# Patient Record
Sex: Female | Born: 1960
Health system: Southern US, Community
[De-identification: ages and names within clinical notes are randomized; demographics above are authoritative.]

## PROBLEM LIST (undated history)

## (undated) DIAGNOSIS — D649 Anemia, unspecified: Secondary | ICD-10-CM

## (undated) DIAGNOSIS — E785 Hyperlipidemia, unspecified: Secondary | ICD-10-CM

## (undated) HISTORY — PX: PARTIAL HYSTERECTOMY: SHX80

## (undated) HISTORY — DX: Anemia, unspecified: D64.9

## (undated) HISTORY — DX: Hyperlipidemia, unspecified: E78.5

---

## 1997-11-05 ENCOUNTER — Other Ambulatory Visit: Admission: RE | Admit: 1997-11-05 | Discharge: 1997-11-05 | Payer: Self-pay | Admitting: Obstetrics and Gynecology

## 1998-05-10 ENCOUNTER — Ambulatory Visit (HOSPITAL_COMMUNITY): Admission: RE | Admit: 1998-05-10 | Discharge: 1998-05-10 | Payer: Self-pay | Admitting: Obstetrics and Gynecology

## 1998-11-26 ENCOUNTER — Other Ambulatory Visit: Admission: RE | Admit: 1998-11-26 | Discharge: 1998-11-26 | Payer: Self-pay | Admitting: Obstetrics and Gynecology

## 1999-12-25 ENCOUNTER — Other Ambulatory Visit: Admission: RE | Admit: 1999-12-25 | Discharge: 1999-12-25 | Payer: Self-pay | Admitting: Obstetrics and Gynecology

## 2001-11-16 ENCOUNTER — Other Ambulatory Visit: Admission: RE | Admit: 2001-11-16 | Discharge: 2001-11-16 | Payer: Self-pay | Admitting: Obstetrics and Gynecology

## 2005-02-12 ENCOUNTER — Other Ambulatory Visit: Admission: RE | Admit: 2005-02-12 | Discharge: 2005-02-12 | Payer: Self-pay | Admitting: Obstetrics and Gynecology

## 2009-02-01 ENCOUNTER — Encounter (INDEPENDENT_AMBULATORY_CARE_PROVIDER_SITE_OTHER): Payer: Self-pay | Admitting: *Deleted

## 2009-02-14 ENCOUNTER — Ambulatory Visit: Payer: Self-pay | Admitting: Internal Medicine

## 2009-02-14 ENCOUNTER — Encounter: Payer: Self-pay | Admitting: Gastroenterology

## 2009-02-15 ENCOUNTER — Encounter: Payer: Self-pay | Admitting: Gastroenterology

## 2009-02-20 LAB — CONVERTED CEMR LAB
IgA: 118 mg/dL (ref 68–378)
Tissue Transglutaminase Ab, IgA: 0.2 units (ref <7)

## 2009-02-21 ENCOUNTER — Encounter: Payer: Self-pay | Admitting: Internal Medicine

## 2009-02-21 ENCOUNTER — Ambulatory Visit (HOSPITAL_COMMUNITY): Admission: RE | Admit: 2009-02-21 | Discharge: 2009-02-21 | Payer: Self-pay | Admitting: Internal Medicine

## 2009-02-21 ENCOUNTER — Ambulatory Visit: Payer: Self-pay | Admitting: Internal Medicine

## 2009-02-25 ENCOUNTER — Encounter: Payer: Self-pay | Admitting: Internal Medicine

## 2009-03-13 ENCOUNTER — Telehealth (INDEPENDENT_AMBULATORY_CARE_PROVIDER_SITE_OTHER): Payer: Self-pay

## 2009-03-15 ENCOUNTER — Emergency Department (HOSPITAL_COMMUNITY): Admission: EM | Admit: 2009-03-15 | Discharge: 2009-03-16 | Payer: Self-pay | Admitting: Emergency Medicine

## 2009-03-18 ENCOUNTER — Telehealth (INDEPENDENT_AMBULATORY_CARE_PROVIDER_SITE_OTHER): Payer: Self-pay

## 2009-03-18 ENCOUNTER — Encounter (INDEPENDENT_AMBULATORY_CARE_PROVIDER_SITE_OTHER): Payer: Self-pay | Admitting: *Deleted

## 2009-04-08 ENCOUNTER — Telehealth (INDEPENDENT_AMBULATORY_CARE_PROVIDER_SITE_OTHER): Payer: Self-pay

## 2009-04-16 ENCOUNTER — Ambulatory Visit: Payer: Self-pay | Admitting: Internal Medicine

## 2009-04-16 DIAGNOSIS — K5150 Left sided colitis without complications: Secondary | ICD-10-CM | POA: Insufficient documentation

## 2009-05-07 ENCOUNTER — Encounter: Payer: Self-pay | Admitting: Internal Medicine

## 2009-06-19 ENCOUNTER — Ambulatory Visit: Payer: Self-pay | Admitting: Internal Medicine

## 2009-07-12 ENCOUNTER — Ambulatory Visit: Payer: Self-pay | Admitting: Internal Medicine

## 2009-07-15 LAB — CONVERTED CEMR LAB
ALT: 23 units/L (ref 0–35)
AST: 17 units/L (ref 0–37)
Albumin: 3.6 g/dL (ref 3.5–5.2)
Alkaline Phosphatase: 39 units/L (ref 39–117)
Basophils Absolute: 0 10*3/uL (ref 0.0–0.1)
Basophils Relative: 0.6 % (ref 0.0–3.0)
Bilirubin, Direct: 0 mg/dL (ref 0.0–0.3)
Eosinophils Absolute: 0 10*3/uL (ref 0.0–0.7)
Eosinophils Relative: 0.3 % (ref 0.0–5.0)
HCT: 32.7 % — ABNORMAL LOW (ref 36.0–46.0)
Hemoglobin: 11 g/dL — ABNORMAL LOW (ref 12.0–15.0)
Lymphocytes Relative: 11.2 % — ABNORMAL LOW (ref 12.0–46.0)
Lymphs Abs: 0.9 10*3/uL (ref 0.7–4.0)
MCHC: 33.6 g/dL (ref 30.0–36.0)
MCV: 89.3 fL (ref 78.0–100.0)
Monocytes Absolute: 0.3 10*3/uL (ref 0.1–1.0)
Monocytes Relative: 3.4 % (ref 3.0–12.0)
Neutro Abs: 6.7 10*3/uL (ref 1.4–7.7)
Neutrophils Relative %: 84.5 % — ABNORMAL HIGH (ref 43.0–77.0)
Platelets: 477 10*3/uL — ABNORMAL HIGH (ref 150.0–400.0)
RBC: 3.66 M/uL — ABNORMAL LOW (ref 3.87–5.11)
RDW: 15.9 % — ABNORMAL HIGH (ref 11.5–14.6)
Total Bilirubin: 0.7 mg/dL (ref 0.3–1.2)
Total Protein: 7.4 g/dL (ref 6.0–8.3)
WBC: 7.9 10*3/uL (ref 4.5–10.5)

## 2009-07-24 ENCOUNTER — Ambulatory Visit: Payer: Self-pay | Admitting: Internal Medicine

## 2009-08-21 LAB — CONVERTED CEMR LAB
ALT: 21 units/L (ref 0–35)
AST: 16 units/L (ref 0–37)
Alkaline Phosphatase: 41 units/L (ref 39–117)
Bilirubin, Direct: 0 mg/dL (ref 0.0–0.3)
Eosinophils Relative: 1.2 % (ref 0.0–5.0)
Monocytes Relative: 8.8 % (ref 3.0–12.0)
Neutrophils Relative %: 64.9 % (ref 43.0–77.0)
Platelets: 450 10*3/uL — ABNORMAL HIGH (ref 150.0–400.0)
RBC: 3.94 M/uL (ref 3.87–5.11)
Total Bilirubin: 0.6 mg/dL (ref 0.3–1.2)
WBC: 7.4 10*3/uL (ref 4.5–10.5)

## 2009-08-22 ENCOUNTER — Ambulatory Visit: Payer: Self-pay | Admitting: Internal Medicine

## 2009-08-26 ENCOUNTER — Ambulatory Visit: Payer: Self-pay | Admitting: Internal Medicine

## 2009-08-26 DIAGNOSIS — D649 Anemia, unspecified: Secondary | ICD-10-CM | POA: Insufficient documentation

## 2009-08-26 LAB — CONVERTED CEMR LAB
Basophils Relative: 1.1 % (ref 0.0–3.0)
Bilirubin, Direct: 0.1 mg/dL (ref 0.0–0.3)
Eosinophils Absolute: 0.1 10*3/uL (ref 0.0–0.7)
MCHC: 33.4 g/dL (ref 30.0–36.0)
MCV: 90.4 fL (ref 78.0–100.0)
Monocytes Absolute: 0.5 10*3/uL (ref 0.1–1.0)
Neutrophils Relative %: 59.1 % (ref 43.0–77.0)
Platelets: 391 10*3/uL (ref 150.0–400.0)
RBC: 3.77 M/uL — ABNORMAL LOW (ref 3.87–5.11)
RDW: 16.3 % — ABNORMAL HIGH (ref 11.5–14.6)
Total Protein: 7.4 g/dL (ref 6.0–8.3)

## 2009-11-18 LAB — CONVERTED CEMR LAB
Folate: >20 ng/mL
Iron: 78 ug/dL (ref 42–145)
Transferrin: 226.2 mg/dL (ref 212.0–360.0)

## 2009-11-28 ENCOUNTER — Ambulatory Visit: Payer: Self-pay | Admitting: Internal Medicine

## 2009-11-29 LAB — CONVERTED CEMR LAB
Albumin: 4 g/dL (ref 3.5–5.2)
Basophils Absolute: 0 10*3/uL (ref 0.0–0.1)
Eosinophils Absolute: 0.2 10*3/uL (ref 0.0–0.7)
HCT: 35.4 % — ABNORMAL LOW (ref 36.0–46.0)
Hemoglobin: 12.1 g/dL (ref 12.0–15.0)
Lymphocytes Relative: 25.2 % (ref 12.0–46.0)
Lymphs Abs: 1.4 10*3/uL (ref 0.7–4.0)
MCHC: 34.3 g/dL (ref 30.0–36.0)
Monocytes Absolute: 0.6 10*3/uL (ref 0.1–1.0)
Neutro Abs: 3.4 10*3/uL (ref 1.4–7.7)
RDW: 15.5 % — ABNORMAL HIGH (ref 11.5–14.6)

## 2010-02-12 ENCOUNTER — Telehealth: Payer: Self-pay | Admitting: Internal Medicine

## 2010-05-16 ENCOUNTER — Ambulatory Visit: Payer: Self-pay | Admitting: Internal Medicine

## 2010-05-16 DIAGNOSIS — M25539 Pain in unspecified wrist: Secondary | ICD-10-CM | POA: Insufficient documentation

## 2010-05-16 LAB — CONVERTED CEMR LAB
ALT: 23 units/L (ref 0–35)
AST: 19 units/L (ref 0–37)
Albumin: 4.5 g/dL (ref 3.5–5.2)
Alkaline Phosphatase: 35 units/L — ABNORMAL LOW (ref 39–117)
Eosinophils Relative: 0.5 % (ref 0.0–5.0)
HCT: 35.9 % — ABNORMAL LOW (ref 36.0–46.0)
Hemoglobin: 12.3 g/dL (ref 12.0–15.0)
Lymphs Abs: 1.2 10*3/uL (ref 0.7–4.0)
Monocytes Relative: 9.1 % (ref 3.0–12.0)
Neutro Abs: 2.8 10*3/uL (ref 1.4–7.7)
RBC: 3.61 M/uL — ABNORMAL LOW (ref 3.87–5.11)
WBC: 4.5 10*3/uL (ref 4.5–10.5)

## 2010-06-13 ENCOUNTER — Telehealth: Payer: Self-pay | Admitting: Internal Medicine

## 2010-07-07 ENCOUNTER — Telehealth: Payer: Self-pay | Admitting: Internal Medicine

## 2010-07-08 ENCOUNTER — Ambulatory Visit: Payer: Self-pay | Admitting: Internal Medicine

## 2010-07-09 ENCOUNTER — Telehealth: Payer: Self-pay | Admitting: Internal Medicine

## 2010-07-22 ENCOUNTER — Telehealth: Payer: Self-pay | Admitting: Internal Medicine

## 2010-08-09 IMAGING — CR DG CHEST 1V PORT
1 series · 1 of 1 positions shown · non-contrast
Comparison: None.

CLINICAL DATA: Palpitations.  Left arm tingling.

PORTABLE CHEST - 1 VIEW

[view not recorded]
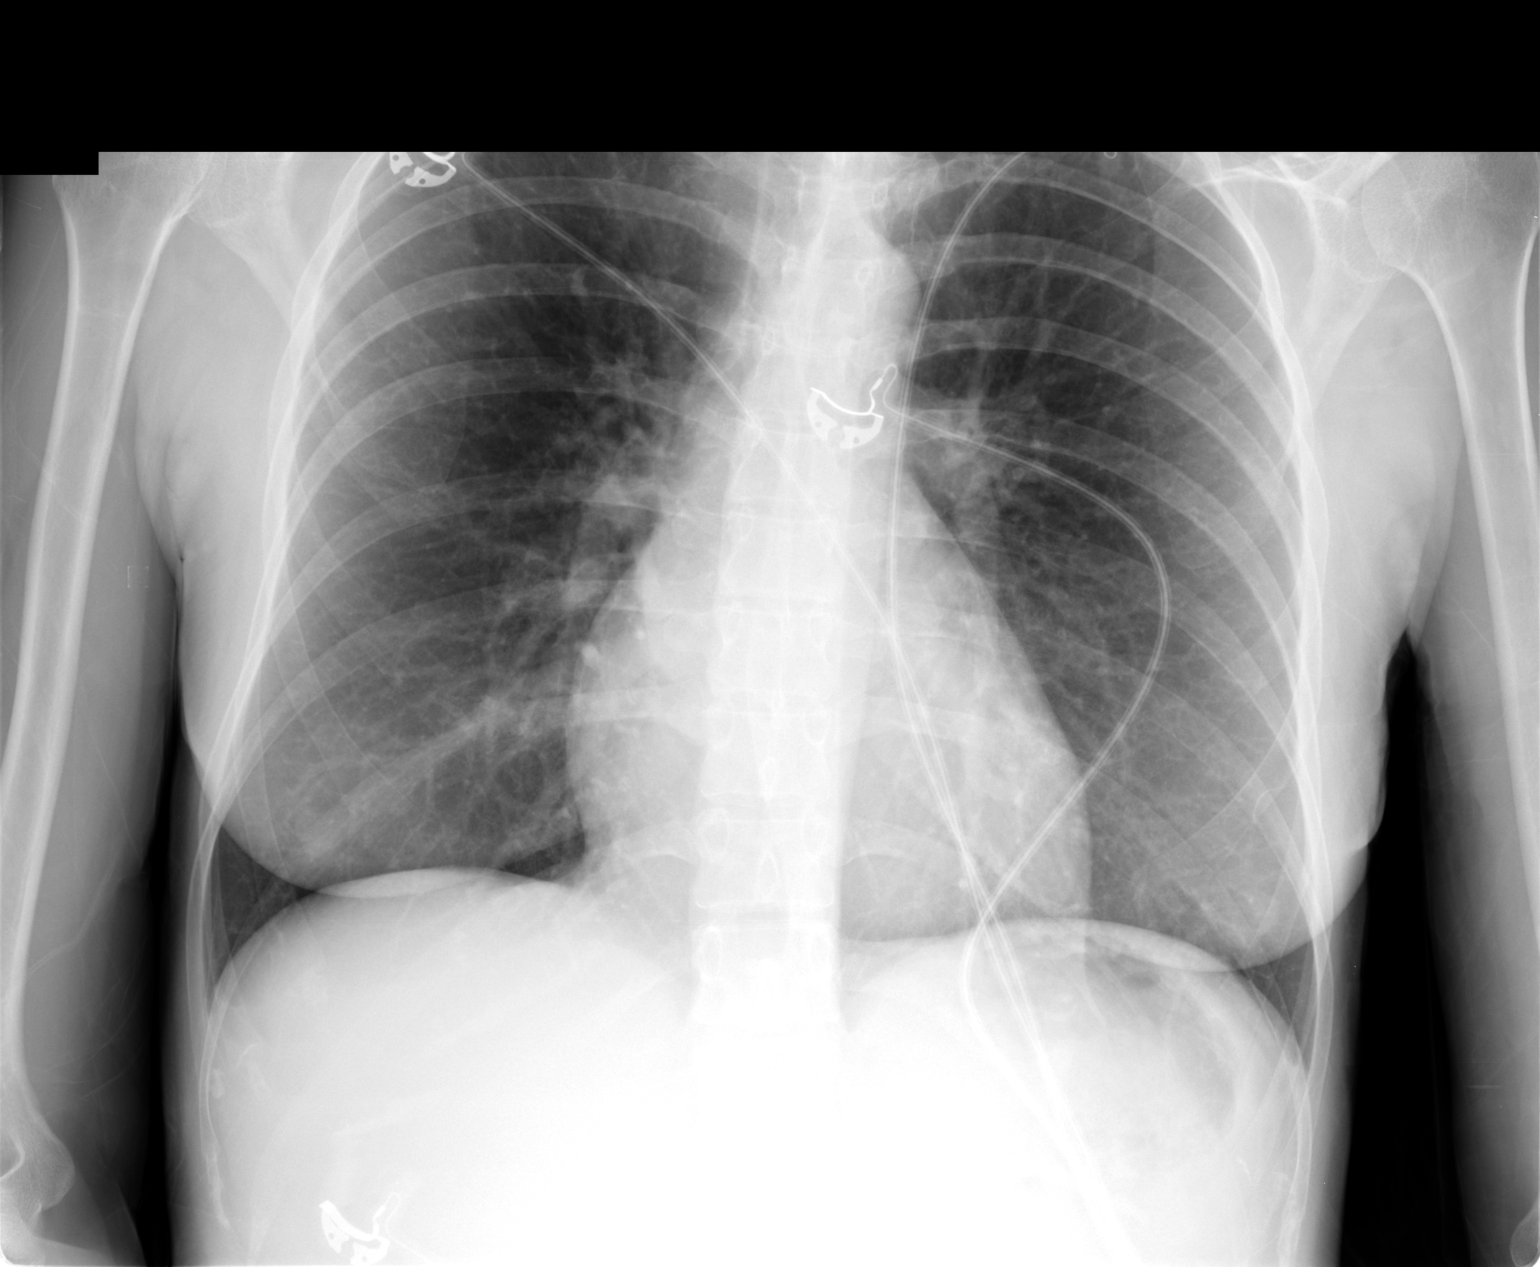

[1 of 1 positions shown; findings below may reference images not displayed]

FINDINGS: Normal sized heart.  Clear lungs with normal vascularity.
Mild scoliosis, possibly positional.
IMPRESSION: No acute abnormality.

## 2010-08-11 ENCOUNTER — Ambulatory Visit: Payer: Self-pay | Admitting: Internal Medicine

## 2010-08-12 LAB — CONVERTED CEMR LAB
ALT: 31 units/L (ref 0–35)
AST: 19 units/L (ref 0–37)
Basophils Relative: 0.6 % (ref 0.0–3.0)
Bilirubin, Direct: 0 mg/dL (ref 0.0–0.3)
Eosinophils Relative: 1.6 % (ref 0.0–5.0)
HCT: 33.3 % — ABNORMAL LOW (ref 36.0–46.0)
Hemoglobin: 11.2 g/dL — ABNORMAL LOW (ref 12.0–15.0)
Lymphs Abs: 1.2 10*3/uL (ref 0.7–4.0)
MCV: 101.4 fL — ABNORMAL HIGH (ref 78.0–100.0)
Monocytes Absolute: 0.7 10*3/uL (ref 0.1–1.0)
Monocytes Relative: 12.1 % — ABNORMAL HIGH (ref 3.0–12.0)
RBC: 3.29 M/uL — ABNORMAL LOW (ref 3.87–5.11)
Total Bilirubin: 0.4 mg/dL (ref 0.3–1.2)
Total Protein: 7 g/dL (ref 6.0–8.3)
WBC: 5.5 10*3/uL (ref 4.5–10.5)

## 2010-08-13 ENCOUNTER — Telehealth: Payer: Self-pay | Admitting: Internal Medicine

## 2010-09-03 ENCOUNTER — Ambulatory Visit
Admission: RE | Admit: 2010-09-03 | Discharge: 2010-09-03 | Payer: Self-pay | Source: Home / Self Care | Attending: Internal Medicine | Admitting: Internal Medicine

## 2010-09-03 ENCOUNTER — Other Ambulatory Visit: Payer: Self-pay | Admitting: Internal Medicine

## 2010-09-03 LAB — HEPATIC FUNCTION PANEL
ALT: 19 U/L (ref 0–35)
AST: 17 U/L (ref 0–37)
Albumin: 3.8 g/dL (ref 3.5–5.2)
Alkaline Phosphatase: 45 U/L (ref 39–117)
Bilirubin, Direct: 0 mg/dL (ref 0.0–0.3)
Total Bilirubin: 0.5 mg/dL (ref 0.3–1.2)
Total Protein: 7.6 g/dL (ref 6.0–8.3)

## 2010-09-03 LAB — CBC WITH DIFFERENTIAL/PLATELET
Basophils Absolute: 0 10*3/uL (ref 0.0–0.1)
Basophils Relative: 0.7 % (ref 0.0–3.0)
Eosinophils Absolute: 0.1 10*3/uL (ref 0.0–0.7)
Eosinophils Relative: 1.5 % (ref 0.0–5.0)
HCT: 33.7 % — ABNORMAL LOW (ref 36.0–46.0)
Hemoglobin: 11.4 g/dL — ABNORMAL LOW (ref 12.0–15.0)
Lymphocytes Relative: 21.5 % (ref 12.0–46.0)
Lymphs Abs: 1.1 10*3/uL (ref 0.7–4.0)
MCHC: 33.9 g/dL (ref 30.0–36.0)
MCV: 100.1 fl — ABNORMAL HIGH (ref 78.0–100.0)
Monocytes Absolute: 0.7 10*3/uL (ref 0.1–1.0)
Monocytes Relative: 13.8 % — ABNORMAL HIGH (ref 3.0–12.0)
Neutro Abs: 3.1 10*3/uL (ref 1.4–7.7)
Neutrophils Relative %: 62.5 % (ref 43.0–77.0)
Platelets: 451 10*3/uL — ABNORMAL HIGH (ref 150.0–400.0)
RBC: 3.36 Mil/uL — ABNORMAL LOW (ref 3.87–5.11)
RDW: 15.7 % — ABNORMAL HIGH (ref 11.5–14.6)
WBC: 5 10*3/uL (ref 4.5–10.5)

## 2010-09-05 ENCOUNTER — Other Ambulatory Visit: Payer: Self-pay | Admitting: Internal Medicine

## 2010-09-05 ENCOUNTER — Ambulatory Visit
Admission: RE | Admit: 2010-09-05 | Discharge: 2010-09-05 | Payer: Self-pay | Source: Home / Self Care | Attending: Internal Medicine | Admitting: Internal Medicine

## 2010-09-05 DIAGNOSIS — M549 Dorsalgia, unspecified: Secondary | ICD-10-CM | POA: Insufficient documentation

## 2010-09-05 DIAGNOSIS — R5381 Other malaise: Secondary | ICD-10-CM | POA: Insufficient documentation

## 2010-09-05 DIAGNOSIS — R5383 Other fatigue: Secondary | ICD-10-CM

## 2010-09-05 LAB — VITAMIN B12: Vitamin B-12: 942 pg/mL — ABNORMAL HIGH (ref 211–911)

## 2010-09-05 LAB — FERRITIN: Ferritin: 61 ng/mL (ref 10.0–291.0)

## 2010-09-14 LAB — CONVERTED CEMR LAB
ALT: 19 units/L (ref 0–35)
AST: 19 units/L (ref 0–37)
Albumin: 3.4 g/dL — ABNORMAL LOW (ref 3.5–5.2)
Alkaline Phosphatase: 51 units/L (ref 39–117)
BUN: 9 mg/dL (ref 6–23)
Basophils Absolute: 0.1 10*3/uL (ref 0.0–0.1)
Basophils Relative: 1.4 % (ref 0.0–3.0)
CO2: 29 meq/L (ref 19–32)
CRP, High Sensitivity: 15.2 — ABNORMAL HIGH (ref 0.00–5.00)
Calcium: 9.3 mg/dL (ref 8.4–10.5)
Chloride: 101 meq/L (ref 96–112)
Creatinine, Ser: 0.6 mg/dL (ref 0.4–1.2)
Eosinophils Absolute: 0.5 10*3/uL (ref 0.0–0.7)
Eosinophils Relative: 7.5 % — ABNORMAL HIGH (ref 0.0–5.0)
GFR calc non Af Amer: 113.97 mL/min (ref >60)
Glucose, Bld: 95 mg/dL (ref 70–99)
HCT: 31.6 % — ABNORMAL LOW (ref 36.0–46.0)
Hemoglobin: 10.6 g/dL — ABNORMAL LOW (ref 12.0–15.0)
Lymphocytes Relative: 23.9 % (ref 12.0–46.0)
Lymphs Abs: 1.6 10*3/uL (ref 0.7–4.0)
MCHC: 33.5 g/dL (ref 30.0–36.0)
MCV: 87.9 fL (ref 78.0–100.0)
Monocytes Absolute: 1.1 10*3/uL — ABNORMAL HIGH (ref 0.1–1.0)
Monocytes Relative: 16.2 % — ABNORMAL HIGH (ref 3.0–12.0)
Neutro Abs: 3.2 10*3/uL (ref 1.4–7.7)
Neutrophils Relative %: 51 % (ref 43.0–77.0)
Platelets: 594 10*3/uL — ABNORMAL HIGH (ref 150.0–400.0)
Potassium: 4.1 meq/L (ref 3.5–5.1)
RBC: 3.59 M/uL — ABNORMAL LOW (ref 3.87–5.11)
RDW: 15.3 % — ABNORMAL HIGH (ref 11.5–14.6)
Sodium: 138 meq/L (ref 135–145)
Total Bilirubin: 0.5 mg/dL (ref 0.3–1.2)
Total Protein: 8 g/dL (ref 6.0–8.3)
WBC: 6.5 10*3/uL (ref 4.5–10.5)

## 2010-09-18 NOTE — Assessment & Plan Note (Signed)
Summary: UC Follow-up   History of Present Illness Visit Type: follow up  Primary GI MD: Silvano Rusk MD [facility] Primary Provider: Primus Bravo, MD  Requesting Provider: n/a Chief Complaint: ulcerative colitis History of Present Illness:         This is a 50 year old female who presents with ulcerative colitis.  Prior effective therapy has included prednisone.  Ineffective therapy includes Asacol and Lialda due to reactions (fellt terrible and tachycardia) Evaluation to date includes colonoscopy by Dr. Garfield Cornea, and was found to have colitis from rectum to proximal descending colon, with biopsies showing chronic active colitis.  The patient fid have  diarrhea problems when seen in 11/10.  She was begun on 6MP 50 mg daily and tapered her prednisone over weeks.   There is occasional constipation but she is oherwise ok. t otherwise no side effects noted from the 6MP. Lab testing has been ok to dtae.   GI Review of Systems      Denies abdominal pain, acid reflux, belching, bloating, chest pain, dysphagia with liquids, dysphagia with solids, heartburn, loss of appetite, nausea, vomiting, vomiting blood, weight loss, and  weight gain.        Denies anal fissure, black tarry stools, change in bowel habit, constipation, diarrhea, diverticulosis, fecal incontinence, heme positive stool, hemorrhoids, irritable bowel syndrome, jaundice, light color stool, liver problems, rectal bleeding, and  rectal pain.    Current Medications (verified): 1)  Pravachol 20 Mg Tabs (Pravastatin Sodium) .... Take 1 Tablet By Mouth Once A Day 2)  Fish Oil 1000 Mg Caps (Omega-3 Fatty Acids) .... Two Tablets By Mouth At Bedtime 3)  Calcium 600 600 Mg Tabs (Calcium Carbonate) .... Two Tablets By Mouth Once Daily 4)  B Complex  Tabs (B Complex Vitamins) .... One Tablet By Mouth Once Daily 5)  Iron 325 (65 Fe) Mg Tabs (Ferrous Sulfate) .Marland Kitchen.. 1 By Mouth Qd 6)  Mercaptopurine 50 Mg Tabs (Mercaptopurine) .Marland Kitchen.. 1 By Mouth  Qd  Allergies (verified): 1)  ! * Lialda  Past History:  Past Medical History: Hyperlipidemia Ulcerative Colitis - Left-sided (colonoscopy 7/10)  Past Surgical History: Reviewed history from 02/14/2009 and no changes required. Hysterectomy, partial secondary to fibroids  Family History: Reviewed history from 02/14/2009 and no changes required. No FH of Colon Cancer, polyps, liver, IBD.  Social History: Reviewed history from 06/19/2009 and no changes required. 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   Vital Signs:  Patient profile:   50 year old female Height:      64 inches Weight:      122 pounds BMI:     21.02 BSA:     1.59 Pulse rate:   84 / minute Pulse rhythm:   regular BP sitting:   104 / 62  (left arm) Cuff size:   regular  Vitals Entered By: Hope Pigeon CMA (August 26, 2009 10:10 AM)  Physical Exam  General:  Well developed, well nourished, no acute distress. Eyes:  anicteric Lungs:  Clear throughout to auscultation. Heart:  Regular rate and rhythm; no murmurs, rubs,  or bruits. Abdomen:  soft and non-tender   Impression & Recommendations:  Problem # 1:  ULCERATIVE COLITIS-LEFT SIDE (ICD-556.5) Assessment Improved Presented June/July 2010 with new bloddy diarrhea. Colonoscopy 02/21/2009 (Rourk) showed colitis to proximal descending with UC pattern and chronic active colitis on biopsy. She was  intolernt of Lialda and Asacol, it seems. Prednisone was effective. She is clinically resolved, in remission today on 50 mg 6MP daily.  Will continue 6MP. See me routinely in 6 months, sooner if needed. Discussed need for colonoscopy in future due to increased colon cancer risk. Would likely do in early 27's given age now. We also discussed a possible colonoscopy in next 1-2 years to assess disease activity, that is  optional and she is not inclined to pursue this year. She was told that she could need endoscopic evaluation for deterioration in the interim. Orders: TLB-Ferritin (82728-FER) TLB-IBC Pnl (Iron/FE;Transferrin) (83550-IBC) TLB-B12 + Folate Pnl ([id number]-G43/QIX) TLB-CRP-High Sensitivity (C-Reactive Protein) (86140-FCRP)  Problem # 2:  ANEMIA (ICD-285.9) Assessment: Improved mild anemia is better on iron but still anemic with Hgb 11. has not been evaluated for cause re: iron (suspect she is deficient) B12/Folate. Will get an anemia panel.  Problem # 3:  ENCOUNTER FOR LONG-TERM USE OF OTHER MEDICATIONS (ICD-V58.69) Assessment: Comment Only no signs of toxicity or adverse reactions to 6 MP so far  Patient Instructions: 1)  Copy sent to : Primus Bravo, MD 2)  Please continue current medications.  3)  We will call with lab results. 4)  Please schedule a follow-up appointment in 6 months.  Sooner if needed. 5)  The medication list was reviewed and reconciled.  All changed / newly prescribed medications were explained.  A complete medication list was provided to the patient / caregiver.

## 2010-09-18 NOTE — Progress Notes (Signed)
Summary: flare  Phone Note Call from Patient Call back at Home Phone [phone]   Caller: Patient Call For: Dr. Carlean Purl Reason for Call: Talk to Nurse Summary of Call: colitis flare Initial call taken by: Lucien Mons,  June 13, 2010 3:23 PM  Follow-up for Phone Call        Patient with a last few days of diarrhea and small amount of blood in the stool.  She is c/o 2 loose stools a day, cramping, and small amount of bleeding.  She currently is on 6MP 50 mg.  She had one episode of nausea yesterday while having a BM, but no further nausea/vomiting.  Cramping is the biggest complaint at this time.  She is worried this will get worse.  Dr Ardis Hughs you are MD of the day.  Please advise Follow-up by: Barb Merino RN, [place],  June 13, 2010 3:56 PM  Additional Follow-up for Phone Call Additional follow up Details #1::        please have her start prednisone 10 mg twice daily. She should not taper this until she has a return office visit with Dr. Carlean Purl in 3-4 weeks or so. May need to consider increasing her mercaptopurine dose but there has been a question of side effects in the past.  She should call if she is not feeling better with this prednisone. Additional Follow-up by: Milus Banister MD,  June 13, 2010 4:13 PM    Additional Follow-up for Phone Call Additional follow up Details #2::    Patient advised of Dr Ardis Hughs recommendations she will call me on Monday or Tuesday of next week if not better or increasing symptoms.   Follow-up by: Barb Merino RN, [place],  June 13, 2010 4:27 PM  New/Updated Medications: PREDNISONE 10 MG TABS (PREDNISONE) 1 by mouth two times a day Prescriptions: PREDNISONE 10 MG TABS (PREDNISONE) 1 by mouth two times a day  #60 x 0   Entered by:   Barb Merino RN, CGRN   Authorized by:   Gatha Mayer MD, [facility]   Signed by:   Barb Merino RN, CGRN on 06/13/2010   Method used:   Electronically to        [place] (retail)       [place] [address]       [place], [place]  [address]       Ph: [phone]       Fax: [phone]   RxID:   [id number]

## 2010-09-18 NOTE — Progress Notes (Signed)
Summary: Medication--[place]  Phone Note Call from Patient Call back at Home Phone [phone]   Caller: Patient Call For: Dr. Carlean Purl Reason for Call: Talk to Nurse Summary of Call: [facility] is too expensive.Marland Kitchen..Marland Kitchenneeds an alternative med.  Would like for you to call her before calling something in.  Initial call taken by: Webb Laws,  July 09, 2010 8:18 AM  Follow-up for Phone Call        Dr. Carlean Purl, is there an alternative med that is appropriate for her? Abelino Derrick CMA Deborra Medina)  July 09, 2010 8:22 AM   Additional Follow-up for Phone Call Additional follow up Details #1::        hydrocortisone suppository rxed Gatha Mayer MD, [facility]  July 09, 2010 12:11 PM     Additional Follow-up for Phone Call Additional follow up Details #2::    pt notified.  She will call to see cost before picking up meds.  If med is too expensive she will call us back. Follow-up by: Abelino Derrick CMA Deborra Medina),  July 09, 2010 12:58 PM  New/Updated Medications: ANUSOL-HC 25 MG SUPP (HYDROCORTISONE ACETATE) insert 1 per rectum nightly for 1 month and then stop unless instructed otherwise by Dr. Carlean Purl Prescriptions: ANUSOL-HC 25 MG SUPP (HYDROCORTISONE ACETATE) insert 1 per rectum nightly for 1 month and then stop unless instructed otherwise by Dr. Carlean Purl  #30 x 0   Entered and Authorized by:   Gatha Mayer MD, [facility]   Signed by:   Gatha Mayer MD, [facility] on 07/09/2010   Method used:   Electronically to        [facility] (retail)       [place] [address]       [place], [place]  [address]       Ph: [phone]       Fax: [phone]   RxID:   [id number]

## 2010-09-18 NOTE — Progress Notes (Signed)
Summary: needs to increase 6MP to 75 mg  Phone Note Outgoing Call   Summary of Call: Let patient know that the leveles of 6TG are below the therapeutic range, indicating she needs a higher dose of 6MP. I want her to take 75 mg daily ok to Rx that with 1 month supply and 2 refills should do a CBC and LFT's about 2 weeks after changing the dose Gatha Mayer MD, [facility]  July 22, 2010 6:13 PM   Follow-up for Phone Call        pt states she is feeling well and is a bit hesitant to increase.  Pt states she will increase to 5m.  New rx sent to pharmacy with a note to hold until patient needs.  Pt will come the week after Christmas for labs.  She was told at visit to cancel appt for REV with Dr.Gessner on the 28th if she is feeling better. Pt continues to take Prednisone 10 mg and would like to know when to decrease to 557m Dr. GeCarlean Purlplease advise about presnisone. Follow-up by: StAbelino DerrickMA (ADeborra Medina  July 23, 2010 1:40 PM  Additional Follow-up for Phone Call Additional follow up Details #1::        If she is ok go to 5 mg daily on prednisone and if ok after 2 weeks stop completeley lats have her see me in january instead. we will call lab resuls  the fact that she has needed prednisone and levels are low is why we are increasing 6MP - want her to avoid prednisone if possible Additional Follow-up by: CaGatha MayerD, FAMarval Regal July 23, 2010 2:11 PM    Additional Follow-up for Phone Call Additional follow up Details #2::    notified pt of above.  she is agreeable with the above plan.  Appt rescheduled form 12/28 to 1/20. Follow-up by: StAbelino DerrickMA (ADeborra Medina  July 24, 2010 9:58 AM  New/Updated Medications: MERCAPTOPURINE 50 MG TABS (MERCAPTOPURINE) take 1 and 1/2 tablets by mouth once daily Prescriptions: MERCAPTOPURINE 50 MG TABS (MERCAPTOPURINE) take 1 and 1/2 tablets by mouth once daily  #45 x 2   Entered by:   StAbelino DerrickMA ([place] Authorized by:   CaGatha MayerD, [facility]Wau[facility] Signed by:   StAbelino DerrickMA (AANew Franklinon 07/23/2010   Method used:   [facility]        [facility]       [address]     [place]  [address]     Ph: [phone]     Fax: [phone] RxID:   [id number]

## 2010-09-18 NOTE — Assessment & Plan Note (Signed)
Summary: follow up UC/lk   History of Present Illness Primary GI MD: Silvano Rusk MD [facility] Primary Randon Somera: Primus Bravo, MD  Requesting Caelynn Marshman: n/a Chief Complaint: follow up UC, has been having arm muscle pains which pt state is a side effect of the 44m. Pt wants to discuss but denies any other sx. History of Present Illness:   50yo ww with le321fsided UC She reports felling well on 6MP except that she is having some forearm pains she ?'s are related to 6MP.  6 months of left > right forearm pain, no injury, no repetitive motion worse with squeezing  stopped Pravachol x 3 weeks and did not change  Pap smear ok Dx osteoporosis this year   GI Review of Systems      Denies abdominal pain, acid reflux, belching, bloating, chest pain, dysphagia with liquids, dysphagia with solids, heartburn, loss of appetite, nausea, vomiting, vomiting blood, weight loss, and  weight gain.        Denies anal fissure, black tarry stools, change in bowel habit, constipation, diarrhea, diverticulosis, fecal incontinence, heme positive stool, hemorrhoids, irritable bowel syndrome, jaundice, light color stool, liver problems, rectal bleeding, and  rectal pain.    Current Medications (verified): 1)  Pravachol 20 Mg Tabs (Pravastatin Sodium) .... Take 1 Tablet By Mouth Once A Day 2)  Fish Oil 1000 Mg Caps (Omega-3 Fatty Acids) .... Two Tablets By Mouth At Bedtime 3)  Calcium 600 600 Mg Tabs (Calcium Carbonate) .... Two Tablets By Mouth Once Daily 4)  B Complex  Tabs (B Complex Vitamins) .... One Tablet By Mouth Once Daily 5)  Iron 325 (65 Fe) Mg Tabs (Ferrous Sulfate) ...Marland Kitchen 1 By Mouth Two Times A Day 6)  Mercaptopurine 50 Mg Tabs (Mercaptopurine) ...Marland Kitchen 1 By Mouth Qd 7)  Vitamin D 1000 Unit Tabs (Cholecalciferol) .... One Tablet By Mouth Once Daily  Allergies (verified): 1)  ! * Lialda  Past History:  Past Medical History: Hyperlipidemia Ulcerative Colitis - Left-sided (colonoscopy  7/10) Osteoporosis (Dx by Dr. HoMatthew Saras Past Surgical History: Reviewed history from 02/14/2009 and no changes required. Hysterectomy, partial secondary to fibroids  Family History: Reviewed history from 02/14/2009 and no changes required. No FH of Colon Cancer, polyps, liver, IBD.  Social History: Reviewed history from 06/19/2009 and no changes required. 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   Vital Signs:  Patient profile:   [age]ear old female Height:      64 inches Weight:      128 pounds BMI:     22.05 Pulse rate:   80 / minute Pulse rhythm:   regular BP sitting:   108 / 70  (right arm) Cuff size:   regular  Vitals Entered By: AmMarlon PelMA (ADeborra Medina(May 16, 2010 1:22 PM)  Physical Exam  General:  Well developed, well nourished, no acute distress. Lungs:  Clear throughout to auscultation. Heart:  Regular rate and rhythm; no murmurs, rubs,  or bruits. Abdomen:  Soft, nontender and nondistended. No masses, hepatosplenomegaly or hernias noted. Normal bowel sounds. Msk:  forearms, elbows, wrists nontender some pain with squeezing of fingers, pain is in dorsal forearm muscles no joint deformities    Patient Instructions: 1)  If the Aleve  does not relieve the pain in your forearms then seek help from primary care. 2)  At next visit we will screen  for immunity for other diseases and recommend vaccinations afterward. You should obtain a Pneumovax and tetanus booster (if not in 10 years) anytime now. 3)  Be sure to have a PAP smear annually as we discussed. 4)  Please schedule a routine follow-up appointment in 6 -9 months. 5)  We will have you repeat routine labs in 3 months, 08/11/10. 6)  You may stop iron. 7)  Copy sent to : Bonne Dolores, MD; Elsie Lincoln, MD 8)  The medication list was reviewed and  reconciled.  All changed / newly prescribed medications were explained.  A complete medication list was provided to the patient / caregiver.   Flu Vaccine Consent Questions     Do you have a history of severe allergic reactions to this vaccine? no    Any prior history of allergic reactions to egg and/or gelatin? no    Do you have a sensitivity to the preservative Thimersol? no    Do you have a past history of Guillan-Barre Syndrome? no    Do you currently have an acute febrile illness? no    Have you ever had a severe reaction to latex? no    Vaccine information given and explained to patient? yes    Are you currently pregnant? no    Lot Number:[id number]   Exp Date:02/14/2011   Site Given  Right Deltoid IM       Impression & Recommendations:  Problem # 1:  ULCERATIVE COLITIS-LEFT SIDE (ICD-556.5) Presented June/July 2010 with new bloddy diarrhea. Colonoscopy 02/21/2009 ([facility]) showed colitis to proximal descending with UC pattern and chronic active colitis on biopsy. She was intolernt of Lialda and Asacol, it seems. Prednisone was effective. She is clinically resolved, in remission today on 50 mg 6MP daily.  Will continue 6MP. See me routinely in 6-38month, sooner if needed. Discussed need for colonoscopy in future due to increased colon cancer risk. Would likely do in early 570'sgiven age now. We also discussed a possible colonoscopy in next 1-2 years to assess disease activity, that is optional and she is not inclined to pursue this year. She was told that she could need endoscopic evaluation for deterioration in the interim.  Problem # 2:  PAIN IN JOINT, FOREARM ([phone] Assessment: New try naprosyn he shoild see PCP about this  do not think it is from 6MP  Problem # 3:  ENCOUNTER FOR LONG-TERM USE OF OTHER MEDICATIONS (ICD-V58.69) PAP smear ok she says, we discussed need for vigilance and annual OPAP due to 6MP and increased risk of cervical dysplasia vaccine screening next labs  in 3 mos so far no signs of toxicity to 6MP do not know but doubt forearm sxs are from 6MP  Other Orders: Admin 1st Vaccine ((03559 Flu Vaccine 318yr+ (9(74163Flu Vaccine 3y69yr MEDICARE PATIENTS (Q2(A4536dministration Flu vaccine - MCR (G0008)

## 2010-09-18 NOTE — Progress Notes (Signed)
Summary: appt to get in right away  Phone Note Call from Patient Call back at Home Phone [phone]   Call For: Dr Carlean Purl Reason for Call: Talk to Nurse Summary of Call: Prednizone is just not working as well as it should and would like to speak to nurse so she can get an appt to get in. Initial call taken by: Irwin Brakeman [facility] [place],  July 07, 2010 11:23 AM  Follow-up for Phone Call        Patient  was to follow up from a triage on 07/02/10.  Patient  called in and canceled the appointment on 07/01/10.  I have left a message for her to call back . Barb Merino RN, [facility]  July 07, 2010 1:53 PM  Patient  had to cancel the appointment on 07/02/10.  She is on prednisone 20 mg.  She is having diarrhea and an increase in rectal bleeding.  See triage from 06/13/10.  Patient will come in and see Dr Carlean Purl on 07/08/10  Follow-up by: Barb Merino RN, [place],  July 07, 2010 2:37 PM  Additional Follow-up for Phone Call Additional follow up Details #1::        ok Additional Follow-up by: Gatha Mayer MD, [facility],  July 07, 2010 3:26 PM

## 2010-09-18 NOTE — Progress Notes (Signed)
Summary: Rx problem  Phone Note Call from Patient   Summary of Call: hydrocortisone suppository rx not received by pharmacy I called pharmacy to correct  Initial call taken by: Gatha Mayer MD, [facility],  July 09, 2010 5:17 PM

## 2010-09-18 NOTE — Assessment & Plan Note (Signed)
Summary: FOLLOW UP UC//SP   History of Present Illness Visit Type: Follow-up Visit Primary GI MD: Silvano Rusk MD [facility] Primary Provider: [facility] Requesting Provider: n/a Chief Complaint: ulcerative colitis History of Present Illness:   Patient here for follow up of her UC she states that since the increase of her 24m she has been feeling tired and she was wondering about taking vit b12.   Tired and not motivated since Nov. Has really had some before also.  1-2 days of rectal bleeding into toilet with bulging area at anal area with pain, preparation H suppositories resolved it she thinks (3 days ago)   Occasional nocturnal back pain. Would like a refill on hydrocodone which PCP (retired gave her) she is using that 3 times a week. Got Rx 2010 and has restarted using it.     GI Review of Systems      Denies abdominal pain, acid reflux, belching, bloating, chest pain, dysphagia with liquids, dysphagia with solids, heartburn, loss of appetite, nausea, vomiting, vomiting blood, weight loss, and  weight gain.        Denies anal fissure, black tarry stools, change in bowel habit, constipation, diarrhea, diverticulosis, fecal incontinence, heme positive stool, hemorrhoids, irritable bowel syndrome, jaundice, light color stool, liver problems, rectal bleeding, and  rectal pain.    Current Medications (verified): 1)  Pravachol 20 Mg Tabs (Pravastatin Sodium) .... Take 1 Tablet By Mouth Once A Day 2)  Krill Oil 1000 Mg Caps (Krill Oil) .... Take One By Mouth Once Daily 3)  Calcium 600 600 Mg Tabs (Calcium Carbonate) .... Two Tablets By Mouth Once Daily 4)  Biotin 10 Mg Tabs (Biotin) .... Take One By Mouth Once Daily 5)  Mercaptopurine 50 Mg Tabs (Mercaptopurine) .... Take 1 and 1/2 Tablets By Mouth Once Daily  Allergies (verified): 1)  ! *Doristine Johns Past History:  Past Medical History: Reviewed history from 07/08/2010 and no changes required. Hyperlipidemia Ulcerative  Colitis - Left-sided (colonoscopy 7/10) Osteoporosis (Dx by Dr. HMatthew Saras Anemia  Past Surgical History: Reviewed history from 02/14/2009 and no changes required. Hysterectomy, partial secondary to fibroids  Family History: Reviewed history from 02/14/2009 and no changes required. No FH of Colon Cancer, polyps, liver, IBD.  Social History: Reviewed history from 06/19/2009 and no changes required. 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   Review of Systems       tired and fatigued, wants to do more but cannot seem to would like to return to work but not enough energy back pain disrupts sleep but is helped by hydrocodone she is planning to obtain a new PCP but has not yet(Dr. CBerna Sparebecame a hospitalist)  Vital Signs:  Patient profile:   50year old female Height:      64 inches Weight:      127 pounds BMI:     21.88 Pulse rate:   88 / minute Pulse rhythm:   regular BP sitting:   102 / 60  (right arm) Cuff size:   regular  Vitals Entered By: LBernita BuffyCMA (Deborra Medina (September 05, 2010 3:04 PM)  Physical Exam  General:  Well developed, well nourished, no acute distress. Abdomen:  soft and nontender BS+ no masses Rectal:  female staff present 2 tags seen upon inspection, look like resolving external or thrombosed hemorrhoid Msk:  back nontender and no gross  deformities  Psych:  Alert and cooperative. Normal mood and affect.   Impression & Recommendations:  Problem # 1:  ULCERATIVE COLITIS-LEFT SIDE (ICD-556.5) Presented June/July 2010 with new bloddy diarrhea. Colonoscopy 02/21/2009 (Rourk) showed colitis to proximal descending with UC pattern and chronic active colitis on biopsy. She was intolernt of Lialda and Asacol, it seems. Prednisone was effective. Had a flare several months ago(? triggered by Naprosyn) treated with  prednisone.Canasa suppository x 1 month was used, prednisone tapered, and 6MP increased to 75 mg/day (1.3 mg/kg) after 6TG level found to be below therapeutic range  She is better but seems unhappy with increased 6 MP (was reluctant and believes fatigue caused by it)  Orders: TLB-B12, Serum-Total ONLY (71696-V89) TLB-Ferritin (82728-FER)  Problem # 2:  ENCOUNTER FOR LONG-TERM USE OF OTHER MEDICATIONS (ICD-V58.69) Assessment: Unchanged no toxicity - levels at 50 mg/day were sub therapeutic remeasure in a few months she believes that fatigue is related review vaccine and other preventive needs at f/u  Problem # 3:  ANEMIA (ICD-285.9) mild Orders: TLB-B12, Serum-Total ONLY (38101-B51) TLB-Ferritin (82728-FER)  Problem # 4:  BACK PAIN, CHRONIC (ICD-724.5) Assessment: New we had not discussed  before  I have refilled hydrocodone and will do only this time she needs to discuss with a PCP or rheumatologist (declined referral) this could be related to her IBD but not necessarily so  Problem # 5:  FATIGUE (ICD-780.79) Assessment: New likely multifactorial will check B12 and folate needs to try to exercise i suspect some depressive issues as likely but not entirel clear  she should see PCP if B12 and ferritin normal needs regular PCP anyway and was advised again  Patient Instructions: 1)  Please continue current medications.  2)  Hydrocodone was prescribed and mercaptopurne refilled. 3)  Please obtain a primary care physician. 4)  Please try to exercise, start with walking 10-15 minutes most days and gradually increase actvity. 5)  Please schedule a follow-up appointment in 3 months. 6)  Please go to the basement to have your lab tests drawn today. 7)  B12 and iron levels (ferritin) 8)  Copy sent to : [facility] 9)  The medication list was reviewed and reconciled.  All changed / newly prescribed medications were explained.  A complete medication list was provided to  the patient / caregiver. Prescriptions: MERCAPTOPURINE 50 MG TABS (MERCAPTOPURINE) take 1 and 1/2 tablets by mouth once daily  #45 x 2   Entered and Authorized by:   Gatha Mayer MD, [facility]   Signed by:   Gatha Mayer MD, FACG on 09/05/2010   Method used:   Electronically to        [facility] (retail)       [place] [address]       [place], [place]  [address]       Ph: [phone]       Fax: [phone]   RxID:   [id number] HYDROCODONE-ACETAMINOPHEN 5-325 MG TABS (HYDROCODONE-ACETAMINOPHEN) 1 by mouth every 4-6 hours as needed for pain  #45 x 0   Entered and Authorized by:   Gatha Mayer MD, [facility]   Signed by:   Gatha Mayer MD, FACG on 09/05/2010   Method used:   Printed then faxed to ...       [facility] (retail)       [place] [address]  [place], [place]  [address]       Ph: [phone]       Fax: [phone]   RxID:   [id number]  Pt. requested we call pharmacy and have them to place 6MP on hold.  She just picked up Rx. for this yesterday and has 1 RF.  I called and informed pharmacy. Randye Lobo [facility]  September 05, 2010 4:31 PM

## 2010-09-18 NOTE — Progress Notes (Signed)
Summary: Mercaptopurine question  Phone Note Call from Patient Call back at [facility] Phone [phone]   Summary of Call: Pt has been having joint pain and would like to take a supplement called Turmeric that was recommended by [facility].  She wants to know if this is OK to take with mercaptopurine. Initial call taken by: Abelino Derrick CMA Deborra Medina),  February 12, 2010 11:58 AM  Follow-up for Phone Call        I do not know and will not endorse the use. My research shows that it was used to treat osteoarthritis in combination with other herbs. I recommend she see pcp about joint pain  she is right to check for interactions with any med or supplement  Follow-up by: Gatha Mayer MD, Marval Regal,  February 12, 2010 12:06 PM  Additional Follow-up for Phone Call Additional follow up Details #1::        Notified pt of above.  She is agreeable. Additional Follow-up by: Abelino Derrick CMA Deborra Medina),  February 12, 2010 1:13 PM

## 2010-09-18 NOTE — Assessment & Plan Note (Signed)
Summary: colitis flare/sheri   History of Present Illness Visit Type: Follow-up Visit Primary GI MD: Silvano Rusk MD [facility] Primary Provider: Primus Bravo, MD  Requesting Provider: n/a Chief Complaint: ulcerative colitis History of Present Illness:   50 yo ww with ulcerative colitis and she called late October with diarrhe and rectal bleeding. received prednisone course. Now she feels constipated with rectal pressure and some rectal bleeding. Stooll are choppy, not solid. Significant straining to stool. Problems began after I rxed Aleve for arm pain. Arm pain  is better, especillay after prednisone.   GI Review of Systems    Reports abdominal pain.     Location of  Abdominal pain: lower abdomen.    Denies acid reflux, belching, bloating, chest pain, dysphagia with liquids, dysphagia with solids, heartburn, loss of appetite, nausea, vomiting, vomiting blood, weight loss, and  weight gain.      Reports diarrhea, rectal bleeding, and  rectal pain.     Denies anal fissure, black tarry stools, change in bowel habit, constipation, diverticulosis, fecal incontinence, heme positive stool, hemorrhoids, irritable bowel syndrome, jaundice, light color stool, and  liver problems.    Current Medications (verified): 1)  Pravachol 20 Mg Tabs (Pravastatin Sodium) .... Take 1 Tablet By Mouth Once A Day 2)  Fish Oil 1000 Mg Caps (Omega-3 Fatty Acids) .... Two Tablets By Mouth At Bedtime 3)  Calcium 600 600 Mg Tabs (Calcium Carbonate) .... Two Tablets By Mouth Once Daily 4)  B Complex  Tabs (B Complex Vitamins) .... One Tablet By Mouth Once Daily 5)  Mercaptopurine 50 Mg Tabs (Mercaptopurine) .Marland Kitchen.. 1 By Mouth Qd 6)  Vitamin D 1000 Unit Tabs (Cholecalciferol) .... One Tablet By Mouth Once Daily 7)  Prednisone 10 Mg Tabs (Prednisone) .Marland Kitchen.. 1 By Mouth Two Times A Day  Allergies (verified): 1)  ! * Lialda  Past History:  Past Medical History: Hyperlipidemia Ulcerative Colitis - Left-sided (colonoscopy  7/10) Osteoporosis (Dx by Dr. Matthew Saras) Anemia  Past Surgical History: Reviewed history from 02/14/2009 and no changes required. Hysterectomy, partial secondary to fibroids  Family History: Reviewed history from 02/14/2009 and no changes required. No FH of Colon Cancer, polyps, liver, IBD.  Social History: Reviewed history from 06/19/2009 and no changes required. 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   Vital Signs:  Patient profile:   50 year old female Height:      64 inches Weight:      121 pounds BMI:     20.84 BSA:     1.58 Pulse rate:   80 / minute Pulse rhythm:   regular BP sitting:   110 / 68  (left arm) Cuff size:   regular  Vitals Entered By: Hope Pigeon CMA (July 08, 2010 10:28 AM)  Physical Exam  General:  Well developed, well nourished, no acute distress. Lungs:  Clear throughout to auscultation. Heart:  Regular rate and rhythm; no murmurs, rubs,  or bruits. Abdomen:  soft and nontender BS+ no masses Rectal:  female staff present normal anoderm no mass, normal stool nontender    Impression & Recommendations:  Problem # 1:  ULCERATIVE COLITIS-LEFT SIDE (ICD-556.5) Assessment Deteriorated Presented June/July 2010 with new bloddy diarrhea. Colonoscopy 02/21/2009 (Rourk) showed colitis to proximal descending with UC pattern and chronic active colitis on biopsy. She was intolernt of Lialda and Asacol, it seems. Prednisone was effective. She is better  after a recent flare (? triggered by Naprosyn)and addition of prednisone 20 mg daily but has not returned to normal baseline.Will continue 6MP 50 mg once daily and try to taper prednisone. Add canasa suppository x 1 month. Thiopurine metabolite levels will be checked: she may need higher dose "I hope not" REV Dec/Jan with immunomodulator labs  then  Orders: Thiopurine Metabolites ([facility] #3200) [phone])  Problem # 2:  ENCOUNTER FOR LONG-TERM USE OF OTHER MEDICATIONS (ICD-V58.69) Assessment: Unchanged no toxicity on labs so far ? if at therapeutic dose of 6MP Orders: Thiopurine Metabolites ([facility] #3200) [phone])  Problem # 3:  PAIN IN JOINT, FOREARM (NGB-618.48) Assessment: Improved Etiology not clear. Prednisone helped more than Naprosyn  Patient Instructions: 1)  Please go to the basement to have your lab tests drawn today.  2)  We will call you with further follow up after reviewing these results. 3)  Please pick up your medications at your pharmacy. CANASA, PREDNISONE 4)  Please schedule a follow up appointment in late December/early January 5)  Copy sent to : Bonne Dolores, MD 6)  The medication list was reviewed and reconciled.  All changed / newly prescribed medications were explained.  A complete medication list was provided to the patient / caregiver. Prescriptions: PREDNISONE 10 MG TABS (PREDNISONE) 1 1/2 tabs by mouth for 1 week then 1 tablet daily until further notice  #100 x 1   Entered and Authorized by:   Gatha Mayer MD, [facility]   Signed by:   Gatha Mayer MD, [facility] on 07/08/2010   Method used:   Electronically to        [facility] (retail)       [place] [address] [place], [place]  [address]       Ph: [phone]       Fax: [phone]   RxID:   [id number] CANASA 1000 MG SUPP (MESALAMINE) 1 per rectum nightly for 1 month  #30 x 1   Entered and Authorized by:   Gatha Mayer MD, [facility]   Signed by:   Gatha Mayer MD, FACG on 07/08/2010   Method used:   Electronically to        [facility] (retail)       [place] [address] [place], [place]  [address]       Ph: [phone]       Fax: [phone]   RxID:   [id number]

## 2010-09-18 NOTE — Progress Notes (Signed)
Summary: lab results  Phone Note Outgoing Call   Summary of Call: labs ok, very slight anemia which has had before will repeat the same labs when she returns for follow-up  if she can she could do them 2 days before 1/20 visit and will have results at [place] MD, [facility]  August 13, 2010 9:24 PM   Follow-up for Phone Call        Lmom for patient to call back. Shella Maxim RN  August 14, 2010 10:04 AM   Spoke with patient to inform her of the lab results and to ask if she will come in early for her labs prior to the 09/05/10 vist. Patient agreed and labs entered. Follow-up by: Shella Maxim RN,  August 14, 2010 3:22 PM

## 2010-11-23 LAB — BASIC METABOLIC PANEL
BUN: 14 mg/dL (ref 6–23)
CO2: 25 mEq/L (ref 19–32)
CO2: 26 mEq/L (ref 19–32)
Calcium: 8.9 mg/dL (ref 8.4–10.5)
Calcium: 9 mg/dL (ref 8.4–10.5)
Chloride: 105 mEq/L (ref 96–112)
Chloride: 104 mEq/L (ref 96–112)
Creatinine, Ser: 0.6 mg/dL (ref 0.4–1.2)
GFR calc Af Amer: >60 mL/min (ref >60)
GFR calc Af Amer: >60 mL/min (ref >60)
Glucose, Bld: 103 mg/dL — ABNORMAL HIGH (ref 70–99)
Glucose, Bld: 86 mg/dL (ref 70–99)
Sodium: 137 mEq/L (ref 135–145)

## 2010-11-23 LAB — CBC
Hemoglobin: 11.5 g/dL — ABNORMAL LOW (ref 12.0–15.0)
MCHC: 34.5 g/dL (ref 30.0–36.0)
MCHC: 34.7 g/dL (ref 30.0–36.0)
MCV: 90.3 fL (ref 78.0–100.0)
MCV: 91.1 fL (ref 78.0–100.0)
Platelets: 361 10*3/uL (ref 150–400)
RBC: 3.65 MIL/uL — ABNORMAL LOW (ref 3.87–5.11)
RDW: 14.1 % (ref 11.5–15.5)
RDW: 13.2 % (ref 11.5–15.5)

## 2010-11-23 LAB — CLOSTRIDIUM DIFFICILE EIA: C difficile Toxins A+B, EIA: NEGATIVE

## 2010-11-23 LAB — FECAL LACTOFERRIN, QUANT: Fecal Lactoferrin: POSITIVE

## 2010-11-23 LAB — OVA AND PARASITE EXAMINATION

## 2010-11-23 LAB — DIFFERENTIAL
Basophils Absolute: 0 10*3/uL (ref 0.0–0.1)
Basophils Relative: 0 % (ref 0–1)
Eosinophils Absolute: 0.9 10*3/uL — ABNORMAL HIGH (ref 0.0–0.7)
Neutro Abs: 4 10*3/uL (ref 1.7–7.7)
Neutrophils Relative %: 54 % (ref 43–77)

## 2010-11-23 LAB — POCT CARDIAC MARKERS: Myoglobin, poc: 28.4 ng/mL (ref 12–200)

## 2010-11-23 LAB — STOOL CULTURE

## 2010-12-04 ENCOUNTER — Other Ambulatory Visit (INDEPENDENT_AMBULATORY_CARE_PROVIDER_SITE_OTHER): Payer: BC Managed Care – PPO

## 2010-12-04 DIAGNOSIS — D649 Anemia, unspecified: Secondary | ICD-10-CM

## 2010-12-04 LAB — FERRITIN: Ferritin: 68.4 ng/mL (ref 10.0–291.0)

## 2010-12-04 LAB — CBC WITH DIFFERENTIAL/PLATELET
Basophils Relative: 0.6 % (ref 0.0–3.0)
Eosinophils Relative: 0.5 % (ref 0.0–5.0)
Lymphs Abs: 1.2 10*3/uL (ref 0.7–4.0)
MCV: 102.5 fl — ABNORMAL HIGH (ref 78.0–100.0)
Monocytes Relative: 12.4 % — ABNORMAL HIGH (ref 3.0–12.0)
Neutro Abs: 3.1 10*3/uL (ref 1.4–7.7)

## 2010-12-04 LAB — HEPATIC FUNCTION PANEL
ALT: 25 U/L (ref 0–35)
AST: 19 U/L (ref 0–37)
Albumin: 4.1 g/dL (ref 3.5–5.2)

## 2010-12-04 NOTE — Progress Notes (Signed)
Quick Note:  Labs are normal or unchanged and acceptable. Repeat CBC and hepatic function panel in 3 months as usual. Please notify patient. Refill 6 MP for 4 months ______

## 2010-12-05 ENCOUNTER — Telehealth: Payer: Self-pay

## 2010-12-05 DIAGNOSIS — K5090 Crohn's disease, unspecified, without complications: Secondary | ICD-10-CM

## 2010-12-05 MED ORDER — MERCAPTOPURINE 50 MG PO TABS: 75.0000 mg | ORAL_TABLET | Freq: Every day | ORAL | Status: DC

## 2010-12-05 NOTE — Progress Notes (Signed)
See phone note from 12/05/10 for further documentation

## 2010-12-05 NOTE — Telephone Encounter (Signed)
Patient aware, new lab orders placed for July

## 2010-12-05 NOTE — Telephone Encounter (Signed)
Message copied by Barb Merino on Fri Dec 05, 2010  1:53 PM ------      Message from: Silvano Rusk      Created: Thu Dec 04, 2010  4:08 PM       Labs are normal or unchanged and acceptable. Repeat CBC and hepatic function panel in 3 months as usual. Please notify patient.      Refill 6 MP for 4 months

## 2010-12-30 NOTE — Op Note (Signed)
[name], [name]              ACCOUNT NO.:  [id number]   MEDICAL RECORD NO.:  [id number]          PATIENT TYPE:  AMB   LOCATION:  DAY                           FACILITY:  [facility]   PHYSICIAN:  R. Garfield Cornea, M.D. DATE OF BIRTH:  [birth year]   DATE OF PROCEDURE:  02/21/2009  DATE OF DISCHARGE:                               OPERATIVE REPORT   PROCEDURE PERFORMED:  Ileocolonoscopy, segmental biopsy, stool sampling.   INDICATIONS FOR PROCEDURE:  [name] with a now 7-week history  of bloody diarrhea.  She had set of stool studies reportedly came back  negative.  CBC and CMET also came back unremarkable recently.  Ileocolonoscopy is now being done.  The risks, benefits, alternatives  and limitations have been discussed.  Please see documentation in the  medical record.   PROCEDURE NOTE:  O2 ext saturation, blood pressure, pulse and  respirations were monitored throughout the entire procedure.   CONSCIOUS SEDATION:  Versed 6 mg IV, Demerol 100 mg IV in divided doses.   INSTRUMENT:  Pentax video chip system.   FINDINGS:  Digital rectal exam revealed no abnormalities.   ENDOSCOPIC FINDINGS:  The prep was good.  Rectum:  Examination of rectal mucosa revealed diffusely abnormal rectal  mucosa with diffuse granularity of the mucosa and complete loss of the  normal vascular pattern.  There are no discrete erosions or ulcers.  The  rectal vault was somewhat small.  I did not attempt to retroflex.  The  mucosa was quite friable.  Please see photos.  Colon:  Colonic mucosa was surveyed from the rectosigmoid junction  through the left, transverse, right colon to area of appendiceal  orifice, ileocecal valve and cecum.  These structures were well seen and  photographed for the record.  Terminal ileum was intubated to 10 cm.  From this level scope was slowly cautiously withdrawn.  All previously  mentioned mucosal surfaces were again seen.  The inflammatory changes of  the rectum  extended confluently up into the colon and extended all the  way to the proximal descending colon.  There was a fairly well  demarcated zone going from abnormal to normal-appearing mucosa at the  level of the proximal descending colon all the way around to the cecum.  The mucosa appeared normal.  Terminal ileal mucosa appeared normal.  Segmental biopsies of the splenic flexure (normal-appearing mucosa) and  then the descending segment (markedly abnormal) and finally rectal  segments were taken for histologic study, also stool samples obtained  for the microbiology lab.  The cecal withdrawal time 10 minutes.  The  patient tolerated the procedure well, was reacted in endoscopy.   IMPRESSION:  Marked inflammatory changes of the rectum and left colon  extending up to the level of the mid descending segment most consistent  with idiopathic inflammatory bowel disease, i.e. ulcerative colitis.  The colonic mucosa more proximal to the mid descending colon appeared  normal, status post segmental biopsy and stool sampling, normal terminal  ileum.   RECOMMENDATIONS:  1. Will go ahead and begin treatment for idiopathic inflammatory bowel  disease with Lialda 4.8 grams orally daily.  2. Hydrocortisone 100 gram enemas 1 per rectum at bedtime x2 weeks.  3. Plan to see this nice lady back in 3 months to assess her progress.  4. She will need a repeat BMET to check her creatinine and 3 months.      Will follow-up on the pending studies in the interim.      Yvonne [name], M.D.  Electronically Signed     RMR/MEDQ  D:  02/21/2009  T:  02/21/2009  Job:  [id number]   cc:   Yvonne [name], M.D.  Fax: [phone]

## 2011-03-03 ENCOUNTER — Other Ambulatory Visit (INDEPENDENT_AMBULATORY_CARE_PROVIDER_SITE_OTHER): Payer: BC Managed Care – PPO

## 2011-03-03 DIAGNOSIS — K5090 Crohn's disease, unspecified, without complications: Secondary | ICD-10-CM

## 2011-03-03 LAB — CBC WITH DIFFERENTIAL/PLATELET
Basophils Absolute: 0 10*3/uL (ref 0.0–0.1)
Eosinophils Absolute: 0 10*3/uL (ref 0.0–0.7)
Lymphocytes Relative: 27.6 % (ref 12.0–46.0)
Lymphs Abs: 1.2 10*3/uL (ref 0.7–4.0)
MCHC: 34.1 g/dL (ref 30.0–36.0)
Monocytes Relative: 7.7 % (ref 3.0–12.0)
Platelets: 269 10*3/uL (ref 150.0–400.0)
RDW: 16.2 % — ABNORMAL HIGH (ref 11.5–14.6)

## 2011-03-03 LAB — HEPATIC FUNCTION PANEL
AST: 19 U/L (ref 0–37)
Alkaline Phosphatase: 39 U/L (ref 39–117)
Total Bilirubin: 0.7 mg/dL (ref 0.3–1.2)

## 2011-03-05 ENCOUNTER — Encounter: Payer: Self-pay | Admitting: Internal Medicine

## 2011-03-05 DIAGNOSIS — D899 Disorder involving the immune mechanism, unspecified: Secondary | ICD-10-CM

## 2011-03-05 DIAGNOSIS — D849 Immunodeficiency, unspecified: Secondary | ICD-10-CM | POA: Insufficient documentation

## 2011-03-05 DIAGNOSIS — Z79899 Other long term (current) drug therapy: Secondary | ICD-10-CM | POA: Insufficient documentation

## 2011-03-05 NOTE — Progress Notes (Signed)
Quick Note:  Labs are normal or unchanged and acceptable. Repeat CBC and hepatic function panel in 3 months as usual. Please notify patient.  She also needs a next available office visit ______

## 2011-03-06 ENCOUNTER — Telehealth: Payer: Self-pay

## 2011-03-06 DIAGNOSIS — K5090 Crohn's disease, unspecified, without complications: Secondary | ICD-10-CM

## 2011-03-06 NOTE — Telephone Encounter (Signed)
Message copied by Marlon Pel on Fri Mar 06, 2011 11:12 AM ------      Message from: Silvano Rusk E      Created: Thu Mar 05, 2011 10:38 PM       Labs are normal or unchanged and acceptable. Repeat CBC and hepatic function panel in 3 months as usual. Please notify patient.            She also needs a next available office visit

## 2011-03-06 NOTE — Telephone Encounter (Signed)
Patient advised.  She wants to call back to schedule REV.  New labs entered for 06/03/11

## 2011-04-14 ENCOUNTER — Ambulatory Visit (HOSPITAL_COMMUNITY)
Admission: RE | Admit: 2011-04-14 | Discharge: 2011-04-14 | Disposition: A | Discharge status: Home / Self Care | Payer: BC Managed Care – PPO | Source: Ambulatory Visit | Attending: Orthopedic Surgery | Admitting: Orthopedic Surgery

## 2011-04-14 DIAGNOSIS — M25559 Pain in unspecified hip: Secondary | ICD-10-CM | POA: Insufficient documentation

## 2011-04-14 DIAGNOSIS — R262 Difficulty in walking, not elsewhere classified: Secondary | ICD-10-CM | POA: Insufficient documentation

## 2011-04-14 DIAGNOSIS — IMO0001 Reserved for inherently not codable concepts without codable children: Secondary | ICD-10-CM | POA: Insufficient documentation

## 2011-04-14 NOTE — Patient Instructions (Signed)
HEP for stretching  for Hamstring, tensor, IT band and piriformis.  Strengthening for hip extensors;  If symptoms return pt told to ice and complete friction massage.

## 2011-04-14 NOTE — Progress Notes (Signed)
Physical Therapy Evaluation  Patient Details  Name: Yvonne [name] MRN: [id number] Date of Birth: November 20, 1960  Today's Date: 04/14/2011 Time: 8295-6213 Time Calculation (min): 14 min Visit#: 1 of 1 Re-eval:  N/A  Past Medical History: No past medical history on file. Past Surgical History: No past surgical history on file.  Subjective Symptoms/Limitations Symptoms: Pt states that she has been having left hip pain for about two months.  The patient states that she was walking two miles a day and started hurting two weeks after she started walkiig .   The patient recieved an injection on the 23rd and has been pain free ever since.  The patint state that  her hip was bothering her throughout the day prior to the injecton.   How long can you sit comfortably?: going from sit to stand would bother her the most prior to the shot. How long can you stand comfortably?: no problem How long can you walk comfortably?: walking did not increase pain. Special Tests: Prior to injection the patient was waking up several times a night. Pain Assessment Currently in Pain?: No/denies (prior to injection pain level was as high as an 8)  Precautions/Restrictions     Prior Functioning  Prior Function Level of Independence: Independent with basic ADLs Driving: Yes Able to Take Stairs Reciprically:  (yes but climbing steps was more painful) Vocation: Full time employment Leisure:  (walking.)  Cognition  alert and oriented.  Sensation/Coordination/Flexibility  tightness noted in multiple mm  Assessment- Pt with tight tensor, hamstring and piriformis mm; weakness in glut maximus.   Mobility (including Balance)     ambulatory  Exercise/Treatments  For stretching and strengthening  Physical Therapy Assessment and Plan PT Assessment and Plan Clinical Impression Statement: Pt with tight IT Band and weak hip extensors. One time visit only.  Pt instructed in IT band stretch, tensor stretch,  hamstring stretch as well a piriformis.  Pt shown strengthening for glut max.  Instructed in ice massage and friction massage only if pain returns. Rehab Potential: Good PT Frequency: Min 1X/week PT Duration:  (1 wk) PT Plan: D/C to HEP    Goals PT Short Term Goals PT Short Term Goal 1: I HEP  Problem List Patient Active Problem List  Diagnoses  . ANEMIA  . ULCERATIVE COLITIS-LEFT SIDE  . PAIN IN JOINT, FOREARM  . BACK PAIN, CHRONIC  . FATIGUE  . Immunosuppression  . long-term (current) use of high-risk medication    PT - End of Session Activity Tolerance: Patient tolerated treatment well General Behavior During Session: [facility] for tasks performed Cognition: [facility] for tasks performed   RUSSELL,CINDY 04/14/2011, 3:52 PM  Physician Documentation Your signature is required to indicate approval of the treatment plan as stated above.  Please sign and either send electronically or make a copy of this report for your files and return this physician signed original.   Please mark one 1.__approve of plan  2. ___approve of plan with the following conditions.   ______________________________                                                          _____________________ Physician Signature  Date

## 2011-05-07 ENCOUNTER — Other Ambulatory Visit: Payer: Self-pay | Admitting: Internal Medicine

## 2011-06-04 ENCOUNTER — Other Ambulatory Visit (INDEPENDENT_AMBULATORY_CARE_PROVIDER_SITE_OTHER): Payer: BC Managed Care – PPO

## 2011-06-04 DIAGNOSIS — K5090 Crohn's disease, unspecified, without complications: Secondary | ICD-10-CM

## 2011-06-04 LAB — HEPATIC FUNCTION PANEL
ALT: 45 U/L — ABNORMAL HIGH (ref 0–35)
AST: 24 U/L (ref 0–37)
Albumin: 4.1 g/dL (ref 3.5–5.2)
Total Bilirubin: 1 mg/dL (ref 0.3–1.2)
Total Protein: 7.5 g/dL (ref 6.0–8.3)

## 2011-06-04 LAB — CBC WITH DIFFERENTIAL/PLATELET
Basophils Relative: 0.6 % (ref 0.0–3.0)
Eosinophils Relative: 0.6 % (ref 0.0–5.0)
HCT: 34.4 % — ABNORMAL LOW (ref 36.0–46.0)
Hemoglobin: 11.4 g/dL — ABNORMAL LOW (ref 12.0–15.0)
Lymphs Abs: 1.1 10*3/uL (ref 0.7–4.0)
MCV: 103.6 fl — ABNORMAL HIGH (ref 78.0–100.0)
Monocytes Absolute: 0.5 10*3/uL (ref 0.1–1.0)
Neutro Abs: 2.9 10*3/uL (ref 1.4–7.7)
Platelets: 247 10*3/uL (ref 150.0–400.0)
WBC: 4.6 10*3/uL (ref 4.5–10.5)

## 2011-06-05 ENCOUNTER — Other Ambulatory Visit: Payer: Self-pay

## 2011-06-05 DIAGNOSIS — Z23 Encounter for immunization: Secondary | ICD-10-CM

## 2011-06-05 NOTE — Progress Notes (Signed)
Quick Note:  Labs are acceptable. Repeat CBC and hepatic function panel in 3 months as usual. Please notify patient. She also needs REV before end of year If has not obtained influenza vaccine, she should.  ______

## 2011-06-06 ENCOUNTER — Other Ambulatory Visit: Payer: Self-pay | Admitting: Internal Medicine

## 2011-06-09 ENCOUNTER — Ambulatory Visit: Payer: BC Managed Care – PPO

## 2011-06-10 ENCOUNTER — Ambulatory Visit (INDEPENDENT_AMBULATORY_CARE_PROVIDER_SITE_OTHER): Payer: BC Managed Care – PPO | Admitting: Internal Medicine

## 2011-06-10 DIAGNOSIS — Z23 Encounter for immunization: Secondary | ICD-10-CM

## 2011-09-08 ENCOUNTER — Other Ambulatory Visit (INDEPENDENT_AMBULATORY_CARE_PROVIDER_SITE_OTHER): Payer: BC Managed Care – PPO

## 2011-09-08 DIAGNOSIS — K5190 Ulcerative colitis, unspecified, without complications: Secondary | ICD-10-CM

## 2011-09-08 LAB — CBC WITH DIFFERENTIAL/PLATELET
Basophils Relative: 0.7 % (ref 0.0–3.0)
Eosinophils Relative: 0.6 % (ref 0.0–5.0)
Lymphocytes Relative: 21.3 % (ref 12.0–46.0)
Monocytes Relative: 9 % (ref 3.0–12.0)
Neutrophils Relative %: 68.4 % (ref 43.0–77.0)
Platelets: 316 10*3/uL (ref 150.0–400.0)
RBC: 3.43 Mil/uL — ABNORMAL LOW (ref 3.87–5.11)
WBC: 4.6 10*3/uL (ref 4.5–10.5)

## 2011-09-08 LAB — HEPATIC FUNCTION PANEL
ALT: 55 U/L — ABNORMAL HIGH (ref 0–35)
Alkaline Phosphatase: 40 U/L (ref 39–117)
Bilirubin, Direct: 0 mg/dL (ref 0.0–0.3)
Total Bilirubin: 0.4 mg/dL (ref 0.3–1.2)
Total Protein: 7.4 g/dL (ref 6.0–8.3)

## 2011-09-11 ENCOUNTER — Other Ambulatory Visit: Payer: Self-pay

## 2011-09-11 DIAGNOSIS — K5150 Left sided colitis without complications: Secondary | ICD-10-CM

## 2011-09-11 NOTE — Progress Notes (Signed)
Quick Note:  Labs are normal or unchanged and acceptable. Repeat CBC and hepatic function panel in 3 months as usual. Please notify patient. Will see her at 2/20 appt also ______

## 2011-09-25 ENCOUNTER — Ambulatory Visit: Payer: BC Managed Care – PPO | Admitting: Internal Medicine

## 2011-10-06 ENCOUNTER — Encounter: Payer: Self-pay | Admitting: Internal Medicine

## 2011-10-06 ENCOUNTER — Other Ambulatory Visit: Payer: BC Managed Care – PPO

## 2011-10-06 ENCOUNTER — Ambulatory Visit (INDEPENDENT_AMBULATORY_CARE_PROVIDER_SITE_OTHER): Payer: BC Managed Care – PPO | Admitting: Internal Medicine

## 2011-10-06 DIAGNOSIS — Z79899 Other long term (current) drug therapy: Secondary | ICD-10-CM

## 2011-10-06 DIAGNOSIS — D899 Disorder involving the immune mechanism, unspecified: Secondary | ICD-10-CM

## 2011-10-06 DIAGNOSIS — D849 Immunodeficiency, unspecified: Secondary | ICD-10-CM

## 2011-10-06 DIAGNOSIS — K5150 Left sided colitis without complications: Secondary | ICD-10-CM

## 2011-10-06 NOTE — Assessment & Plan Note (Addendum)
Doing well overall and current regimen of 6 MP 75 mg daily. Her MCV is up, she is mild elevation of her transaminase. I'm going to check 6-TG and other metabolite levels to make sure her doses titrated appropriately. I will see her routinely in a year, sooner as needed. She was asking about a probiotic to help with her occasional gas and missed defecation and I told her that should be okay. She will try that. She believe she eats a high-fiber diet already.

## 2011-10-06 NOTE — Assessment & Plan Note (Addendum)
reviewed the need for prophylactic vaccinations and recommended pneumococcal vaccine again. She has declined to do so. She did get a flu shot this year and understands the need to get one of those annually. We reviewed that she should not have a live vaccines. Should she want to get zoster vaccine she would have to hold her 6-MP most likely.

## 2011-10-06 NOTE — Assessment & Plan Note (Signed)
Continue serial monitoring of LFTs and CBC. She will have metabolite levels checked today.

## 2011-10-06 NOTE — Progress Notes (Signed)
  Subjective:    Patient ID: CIERRIA HEIGHT, female    DOB: 1961-08-16, 51 y.o.   MRN: [id number]  HPI Pleasant white woman returns for follow-up of left-sided ulcerative colitis on 6 MP. Dose increased to 75 mg/day late 2011. She feels well overall but descries "occasional irregularity" - misses defecation some days and does not like that - has increased gas occurs 1x/week No pain, diarrhea or bleeding. She has not gotten pneumonia vaccine. Had influenza October 2012.  Review of Systems Denies hospitalizations, illnesses Now seeing Dr. Sallee Lange  LDL is down to 118 on increased pravastatin    Objective:   Physical Exam General:  NAD Eyes:   anicteric Lungs:  clear Heart:  S1S2 no rubs, murmurs or gallops Abdomen:  soft and nontender, BS+     Data Reviewed:  Lab Results  Component Value Date   WBC 4.6 09/08/2011   HGB 11.7* 09/08/2011   HCT 34.8* 09/08/2011   MCV 101.7* 09/08/2011   PLT 316.0 09/08/2011     Chemistry      Component Value Date/Time   NA 138 06/19/2009 0000   K 4.1 06/19/2009 0000   CL 101 06/19/2009 0000   CO2 29 06/19/2009 0000   BUN 9 06/19/2009 0000   CREATININE 0.6 06/19/2009 0000      Component Value Date/Time   CALCIUM 9.3 06/19/2009 0000   ALKPHOS 40 09/08/2011 1336   AST 30 09/08/2011 1336   ALT 55* 09/08/2011 1336   BILITOT 0.4 09/08/2011 1336            Assessment & Plan:

## 2011-10-06 NOTE — Patient Instructions (Signed)
Please go to the basement upon leaving today to have your labs done. Return to see Dr. Carlean Purl in 1 year. Dr. Carlean Purl has recommended that you get a pneumonia injection and yearly flu injections.

## 2011-10-12 ENCOUNTER — Other Ambulatory Visit: Payer: Self-pay | Admitting: Gastroenterology

## 2011-10-12 MED ORDER — MERCAPTOPURINE 50 MG PO TABS: 75.0000 mg | ORAL_TABLET | Freq: Every day | ORAL | Status: DC

## 2011-10-12 NOTE — Telephone Encounter (Signed)
Refilled Mercaptopurine 50 mg 1 1/2 qd #45 with 6 refills

## 2011-11-04 ENCOUNTER — Telehealth: Payer: Self-pay | Admitting: Internal Medicine

## 2011-11-04 NOTE — Telephone Encounter (Signed)
Patient calling for [facility] results.  I have placed a copy in your office.  Please advise

## 2011-11-05 NOTE — Telephone Encounter (Signed)
Left message for patient to call back  

## 2011-11-05 NOTE — Telephone Encounter (Signed)
Left message for her to call back and ask for [facility]  Let her know levels of metabolites from 6MP are not in toxic range - they are ok and probably not related to elevated LFT's that Dr. Wolfgang Phoenix found. She actually could take a higher dose of 6 MP but I would not do that now  He is holding her statin and rechecking them and that makes sense - will wait to see those repeat LFT's  Let me know if she has any other specific ?'s

## 2011-11-05 NOTE — Telephone Encounter (Signed)
Message left that I would call back re: results

## 2011-11-06 NOTE — Telephone Encounter (Signed)
Patient aware.

## 2011-11-06 NOTE — Telephone Encounter (Signed)
Left message for patient to call back  

## 2011-11-09 ENCOUNTER — Encounter: Payer: Self-pay | Admitting: Internal Medicine

## 2011-11-11 ENCOUNTER — Encounter: Payer: Self-pay | Admitting: Internal Medicine

## 2011-12-08 ENCOUNTER — Other Ambulatory Visit (INDEPENDENT_AMBULATORY_CARE_PROVIDER_SITE_OTHER): Payer: BC Managed Care – PPO

## 2011-12-08 DIAGNOSIS — K5150 Left sided colitis without complications: Secondary | ICD-10-CM

## 2011-12-08 LAB — HEPATIC FUNCTION PANEL: Albumin: 4.1 g/dL (ref 3.5–5.2)

## 2011-12-08 LAB — CBC WITH DIFFERENTIAL/PLATELET
Eosinophils Relative: 0.7 % (ref 0.0–5.0)
HCT: 37.7 % (ref 36.0–46.0)
Hemoglobin: 12.4 g/dL (ref 12.0–15.0)
Lymphs Abs: 1.1 10*3/uL (ref 0.7–4.0)
Monocytes Relative: 9.5 % (ref 3.0–12.0)
Platelets: 253 10*3/uL (ref 150.0–400.0)
WBC: 4.2 10*3/uL — ABNORMAL LOW (ref 4.5–10.5)

## 2011-12-08 NOTE — Progress Notes (Signed)
Quick Note:  One of the liver tests is slightly higher - not dangerous level and liver should be ok Could be meds, could be other  Rest of labs are ok  Need to recheck LFT's in 1 month  AR:WPTYYP,EJYLT, MD  ______

## 2011-12-09 NOTE — Progress Notes (Signed)
Quick Note:  Still needs repeat LFT's in 1 month  Let's get copies of those and make sure he gets copies of these  Need to see if we can coordinate so two of [place] not doing them ______

## 2011-12-15 ENCOUNTER — Other Ambulatory Visit: Payer: Self-pay | Admitting: Family Medicine

## 2011-12-15 DIAGNOSIS — R748 Abnormal levels of other serum enzymes: Secondary | ICD-10-CM

## 2011-12-18 ENCOUNTER — Ambulatory Visit (HOSPITAL_COMMUNITY)
Admission: RE | Admit: 2011-12-18 | Discharge: 2011-12-18 | Disposition: A | Discharge status: Home / Self Care | Payer: BC Managed Care – PPO | Source: Ambulatory Visit | Attending: Family Medicine | Admitting: Family Medicine

## 2011-12-18 DIAGNOSIS — K8020 Calculus of gallbladder without cholecystitis without obstruction: Secondary | ICD-10-CM | POA: Insufficient documentation

## 2011-12-18 DIAGNOSIS — R748 Abnormal levels of other serum enzymes: Secondary | ICD-10-CM

## 2011-12-18 DIAGNOSIS — K7689 Other specified diseases of liver: Secondary | ICD-10-CM | POA: Insufficient documentation

## 2011-12-24 ENCOUNTER — Telehealth: Payer: Self-pay | Admitting: Internal Medicine

## 2011-12-24 NOTE — Telephone Encounter (Signed)
She has had recent problems with elevated ALT. Statin was started and then stopped. Ultrasound has shown a cystic lesion 2.7 cm. Thought to probably be hemangioma. Bile duct is mildly dilated at 9.4 millimeters. Single gallstone seen.  The patient is apparently had no symptoms.  LUKING,SCOTT, MD has evaluated with serologies including ceruloplasmin and ferratin and he says these roll negative. The patient was reluctant to undergo CT scan as recommended by radiology to confirm if she actually has a hemangioma. There have been discussions and a followup ultrasound in 9-12 months is suggested as an alternative.  I told him that I thought that a followup ultrasound in 6 months or so was reasonable, MRI would be more definitive to look at the liver and this lesion to determine what is was a reasonable option as well. Right mild rise in her ALT is of unclear etiology at this point. We will continue to follow that.

## 2011-12-31 ENCOUNTER — Telehealth: Payer: Self-pay

## 2011-12-31 NOTE — Telephone Encounter (Signed)
LM for pt to call back re: labs done via Dr. Wolfgang Phoenix

## 2012-01-05 ENCOUNTER — Telehealth: Payer: Self-pay

## 2012-01-05 DIAGNOSIS — K5150 Left sided colitis without complications: Secondary | ICD-10-CM

## 2012-01-05 NOTE — Telephone Encounter (Signed)
LM for pt that it would be ok to come March 08 2012 to get her LFT's done and a CBC also per Dr. Carlean Purl.  CBC order put in computer for future.

## 2012-01-07 ENCOUNTER — Other Ambulatory Visit: Payer: Self-pay

## 2012-01-07 DIAGNOSIS — R945 Abnormal results of liver function studies: Secondary | ICD-10-CM

## 2012-03-09 ENCOUNTER — Other Ambulatory Visit (INDEPENDENT_AMBULATORY_CARE_PROVIDER_SITE_OTHER): Payer: BC Managed Care – PPO

## 2012-03-09 DIAGNOSIS — K5150 Left sided colitis without complications: Secondary | ICD-10-CM

## 2012-03-09 DIAGNOSIS — R7989 Other specified abnormal findings of blood chemistry: Secondary | ICD-10-CM

## 2012-03-09 DIAGNOSIS — R945 Abnormal results of liver function studies: Secondary | ICD-10-CM

## 2012-03-09 LAB — CBC WITH DIFFERENTIAL/PLATELET
Basophils Relative: 0.5 % (ref 0.0–3.0)
Eosinophils Absolute: 0 10*3/uL (ref 0.0–0.7)
HCT: 37.4 % (ref 36.0–46.0)
Hemoglobin: 12.1 g/dL (ref 12.0–15.0)
Lymphocytes Relative: 19.6 % (ref 12.0–46.0)
Lymphs Abs: 1 10*3/uL (ref 0.7–4.0)
MCHC: 32.5 g/dL (ref 30.0–36.0)
Monocytes Relative: 12.7 % — ABNORMAL HIGH (ref 3.0–12.0)
Neutro Abs: 3.3 10*3/uL (ref 1.4–7.7)
RBC: 3.69 Mil/uL — ABNORMAL LOW (ref 3.87–5.11)
RDW: 15.9 % — ABNORMAL HIGH (ref 11.5–14.6)

## 2012-03-09 LAB — HEPATIC FUNCTION PANEL
AST: 32 U/L (ref 0–37)
Albumin: 4.2 g/dL (ref 3.5–5.2)
Alkaline Phosphatase: 43 U/L (ref 39–117)
Total Protein: 7.4 g/dL (ref 6.0–8.3)

## 2012-03-11 ENCOUNTER — Other Ambulatory Visit: Payer: Self-pay | Admitting: *Deleted

## 2012-03-11 DIAGNOSIS — K5190 Ulcerative colitis, unspecified, without complications: Secondary | ICD-10-CM

## 2012-03-17 ENCOUNTER — Encounter: Payer: Self-pay | Admitting: Internal Medicine

## 2012-03-17 DIAGNOSIS — R748 Abnormal levels of other serum enzymes: Secondary | ICD-10-CM | POA: Insufficient documentation

## 2012-03-17 NOTE — Progress Notes (Signed)
Quick Note:  Let her know I reviewed the hepatitis testing and other tests from Dr. Wolfgang Phoenix.  No further tests needed now - stick with plan to recheck CBC and LFT's in 3 months ______

## 2012-03-18 ENCOUNTER — Other Ambulatory Visit: Payer: Self-pay

## 2012-03-18 DIAGNOSIS — K5150 Left sided colitis without complications: Secondary | ICD-10-CM

## 2012-05-30 ENCOUNTER — Other Ambulatory Visit: Payer: Self-pay

## 2012-05-30 MED ORDER — MERCAPTOPURINE 50 MG PO TABS: 75.0000 mg | ORAL_TABLET | Freq: Every day | ORAL | Status: DC

## 2012-06-03 ENCOUNTER — Other Ambulatory Visit: Payer: Self-pay

## 2012-06-03 ENCOUNTER — Other Ambulatory Visit (INDEPENDENT_AMBULATORY_CARE_PROVIDER_SITE_OTHER): Payer: BC Managed Care – PPO

## 2012-06-03 DIAGNOSIS — K5190 Ulcerative colitis, unspecified, without complications: Secondary | ICD-10-CM

## 2012-06-03 DIAGNOSIS — K5150 Left sided colitis without complications: Secondary | ICD-10-CM

## 2012-06-03 LAB — CBC WITH DIFFERENTIAL/PLATELET
Basophils Relative: 0.3 % (ref 0.0–3.0)
Eosinophils Relative: 0.8 % (ref 0.0–5.0)
HCT: 35.8 % — ABNORMAL LOW (ref 36.0–46.0)
Hemoglobin: 11.9 g/dL — ABNORMAL LOW (ref 12.0–15.0)
Lymphs Abs: 0.8 10*3/uL (ref 0.7–4.0)
MCV: 99.3 fl (ref 78.0–100.0)
Monocytes Relative: 10.3 % (ref 3.0–12.0)
Neutro Abs: 2.8 10*3/uL (ref 1.4–7.7)
WBC: 4.1 10*3/uL — ABNORMAL LOW (ref 4.5–10.5)

## 2012-06-03 LAB — COMPREHENSIVE METABOLIC PANEL
BUN: 14 mg/dL (ref 6–23)
CO2: 27 mEq/L (ref 19–32)
Creatinine, Ser: 0.6 mg/dL (ref 0.4–1.2)
GFR: 110.44 mL/min (ref >60.00)
Glucose, Bld: 88 mg/dL (ref 70–99)
Total Bilirubin: 0.8 mg/dL (ref 0.3–1.2)
Total Protein: 6.9 g/dL (ref 6.0–8.3)

## 2012-06-03 LAB — HEPATIC FUNCTION PANEL
Albumin: 3.7 g/dL (ref 3.5–5.2)
Total Bilirubin: 0.8 mg/dL (ref 0.3–1.2)

## 2012-06-03 NOTE — Progress Notes (Signed)
Quick Note:  Labs are normal or unchanged and acceptable. Repeat CBC and hepatic function panel in 3 months as usual. Please notify patient.  Cc her PCP please ______

## 2012-06-28 ENCOUNTER — Other Ambulatory Visit: Payer: Self-pay | Admitting: Family Medicine

## 2012-06-28 DIAGNOSIS — D1800 Hemangioma unspecified site: Secondary | ICD-10-CM

## 2012-07-19 ENCOUNTER — Other Ambulatory Visit: Payer: Self-pay | Admitting: Family Medicine

## 2012-07-19 ENCOUNTER — Ambulatory Visit (HOSPITAL_COMMUNITY)
Admission: RE | Admit: 2012-07-19 | Discharge: 2012-07-19 | Disposition: A | Discharge status: Home / Self Care | Payer: BC Managed Care – PPO | Source: Ambulatory Visit | Attending: Family Medicine | Admitting: Family Medicine

## 2012-07-19 DIAGNOSIS — K8020 Calculus of gallbladder without cholecystitis without obstruction: Secondary | ICD-10-CM | POA: Insufficient documentation

## 2012-07-19 DIAGNOSIS — D1800 Hemangioma unspecified site: Secondary | ICD-10-CM | POA: Insufficient documentation

## 2012-07-21 ENCOUNTER — Other Ambulatory Visit: Payer: Self-pay | Admitting: Family Medicine

## 2012-07-21 DIAGNOSIS — D1803 Hemangioma of intra-abdominal structures: Secondary | ICD-10-CM

## 2012-07-28 ENCOUNTER — Ambulatory Visit (HOSPITAL_COMMUNITY)
Admission: RE | Admit: 2012-07-28 | Discharge: 2012-07-28 | Disposition: A | Discharge status: Home / Self Care | Payer: BC Managed Care – PPO | Source: Ambulatory Visit | Attending: Family Medicine | Admitting: Family Medicine

## 2012-07-28 DIAGNOSIS — K8020 Calculus of gallbladder without cholecystitis without obstruction: Secondary | ICD-10-CM | POA: Insufficient documentation

## 2012-07-28 DIAGNOSIS — D1809 Hemangioma of other sites: Secondary | ICD-10-CM | POA: Insufficient documentation

## 2012-07-28 DIAGNOSIS — D1803 Hemangioma of intra-abdominal structures: Secondary | ICD-10-CM

## 2012-07-28 DIAGNOSIS — K7689 Other specified diseases of liver: Secondary | ICD-10-CM | POA: Insufficient documentation

## 2012-07-28 MED ORDER — GADOXETATE DISODIUM 0.25 MMOL/ML IV SOLN
6.0000 mL | Freq: Once | INTRAVENOUS | Status: AC | PRN
  Administered 2012-07-28: 6 mL via INTRAVENOUS

## 2012-09-01 ENCOUNTER — Encounter (INDEPENDENT_AMBULATORY_CARE_PROVIDER_SITE_OTHER): Payer: Self-pay | Admitting: *Deleted

## 2012-09-07 ENCOUNTER — Ambulatory Visit (INDEPENDENT_AMBULATORY_CARE_PROVIDER_SITE_OTHER): Payer: BC Managed Care – PPO | Admitting: Internal Medicine

## 2012-09-07 ENCOUNTER — Encounter (INDEPENDENT_AMBULATORY_CARE_PROVIDER_SITE_OTHER): Payer: Self-pay | Admitting: Internal Medicine

## 2012-09-07 VITALS — BP 124/70 | HR 72 | Temp 98.1°F | Ht 64.0 in | Wt 132.2 lb

## 2012-09-07 DIAGNOSIS — K5120 Ulcerative (chronic) proctitis without complications: Secondary | ICD-10-CM

## 2012-09-07 DIAGNOSIS — R7401 Elevation of levels of liver transaminase levels: Secondary | ICD-10-CM

## 2012-09-07 DIAGNOSIS — R748 Abnormal levels of other serum enzymes: Secondary | ICD-10-CM

## 2012-09-07 LAB — CBC WITH DIFFERENTIAL/PLATELET
Basophils Absolute: 0 10*3/uL (ref 0.0–0.1)
Basophils Relative: 0 % (ref 0–1)
Eosinophils Absolute: 0 10*3/uL (ref 0.0–0.7)
Lymphs Abs: 1.1 10*3/uL (ref 0.7–4.0)
MCH: 33.4 pg (ref 26.0–34.0)
Neutrophils Relative %: 69 % (ref 43–77)
Platelets: 343 10*3/uL (ref 150–400)
RBC: 3.95 MIL/uL (ref 3.87–5.11)

## 2012-09-07 LAB — SEDIMENTATION RATE: Sed Rate: 10 mm/hr (ref 0–22)

## 2012-09-07 LAB — HEPATIC FUNCTION PANEL
Albumin: 4.9 g/dL (ref 3.5–5.2)
Alkaline Phosphatase: 49 U/L (ref 39–117)
Total Bilirubin: 0.8 mg/dL (ref 0.3–1.2)

## 2012-09-07 NOTE — Progress Notes (Addendum)
Subjective:     Patient ID: Yvonne [name], female   DOB: July 11, 1961, 52 y.o.   MRN: [id number]  HPIReferred to our office for UC and elevated liver enzymes. Hx of elevated liver enzymes and previously had been seen Dr. Carlean Purl with  GI. Liver enzymes have been slightly elevated x 1 yr.  Dr. Carlean Purl has been monitoring.  Pravachol has been on hold for about a year.  Presently taking Red Yeast Rice for her cholesterol.  Diagnosed with UC in 2010 on colonoscopy by Dr. Gala Romney.. Maintained on 6MP 30m daily. She tells me she is doing good.  There is no abdominal pain.  She has a BM usually every day. No diarrhea. No bright red rectal bleeding. No mucous.   Appetite is good. No weight loss. She exercises on the treadmill. Started on 6MP November 2010 06/07/2012 cholesterol 213 12/16/2011 Sedrate 5, Ferritin 127, Hep B Surface Antigen Negative, Hep C antibody negative, Ceruloplasmin 26, ANA Negative.   CMP     Component Value Date/Time   NA 139 06/03/2012 0910   K 4.5 06/03/2012 0910   CL 106 06/03/2012 0910   CO2 27 06/03/2012 0910   GLUCOSE 88 06/03/2012 0910   BUN 14 06/03/2012 0910   CREATININE 0.6 06/03/2012 0910   CALCIUM 9.5 06/03/2012 0910   PROT 6.9 06/03/2012 0910   PROT 6.9 06/03/2012 0910   ALBUMIN 3.7 06/03/2012 0910   ALBUMIN 3.7 06/03/2012 0910   AST 30 06/03/2012 0910   AST 30 06/03/2012 0910   ALT 74* 06/03/2012 0910   ALT 74* 06/03/2012 0910   ALKPHOS 40 06/03/2012 0910   ALKPHOS 40 06/03/2012 0910   BILITOT 0.8 06/03/2012 0910   BILITOT 0.8 06/03/2012 0910   GFRNONAA 113.97 06/19/2009 0000   GFRAA  Value: >60        The eGFR has been calculated using the MDRD equation. This calculation has not been validated in all clinical situations. eGFR's persistently <60 mL/min signify possible Chronic Kidney Disease. 03/15/2009 2255   CBC    Component Value Date/Time   WBC 4.1* 06/03/2012 0910   RBC 3.61* 06/03/2012 0910   HGB 11.9* 06/03/2012 0910   HCT 35.8*  06/03/2012 0910   PLT 278.0 06/03/2012 0910   MCV 99.3 06/03/2012 0910   MCHC 33.1 06/03/2012 0910   RDW 15.8* 06/03/2012 0910   LYMPHSABS 0.8 06/03/2012 0910   MONOABS 0.4 06/03/2012 0910   EOSABS 0.0 06/03/2012 0910   BASOSABS 0.0 06/03/2012 0910       02/21/2009 Dr. RGala RomneyColonoscopy IMPRESSION: Marked inflammatory changes of the rectum and left colon  extending up to the level of the mid descending segment most consistent  with idiopathic inflammatory bowel disease, i.e. ulcerative colitis.  The colonic mucosa more proximal to the mid descending colon appeared  normal, status post segmental biopsy and stool sampling, normal terminal   07/21/2012 MR abdomen,   IMPRESSION:  2.9 cm hemangioma in the inferior right hepatic lobe, corresponding  to the sonographic abnormality. Additional benign cysts and  hemangiomas, as described above. No suspicious hepatic lesions.  Mild hepatic steatosis.  Cholelithiasis, without associated findings to suggest acute  cholecystitis.    Review of Systems see hpi Current Outpatient Prescriptions  Medication Sig Dispense Refill  . Biotin 10 MG TABS Take 1 tablet by mouth daily.      . calcium carbonate (OS-CAL) 600 MG TABS Take 1,200 mg by mouth daily.      .Marland KitchenKRILL OIL 1000 MG CAPS  Take 1 capsule by mouth daily.      . mercaptopurine (PURINETHOL) 50 MG tablet Take 1.5 tablets (75 mg total) by mouth daily. Give on an empty stomach 1 hour before or 2 hours after meals. Caution: Chemotherapy.  45 tablet  3  . Red Yeast Rice 600 MG CAPS Take 2 capsules by mouth.       Past Medical History  Diagnosis Date  . Ulcerative colitis   . HLD (hyperlipidemia)   . Osteoporosis   . Anemia    Past Surgical History  Procedure Date  . Partial hysterectomy    Allergies  Allergen Reactions  . Mesalamine Palpitations    Lialda        Objective:   Physical Exam  Filed Vitals:   09/07/12 1025  BP: 124/70  Pulse: 72  Temp: 98.1 F (36.7 C)    Height: 5' 4"  (1.626 m)  Weight: 132 lb 3.2 oz (59.966 kg)    Alert and oriented. Skin warm and dry. Oral mucosa is moist.   . Sclera anicteric, conjunctivae is pink. Thyroid not enlarged. No cervical lymphadenopathy. Lungs clear. Heart regular rate and rhythm.  Abdomen is soft. Bowel sounds are positive. No hepatomegaly. No abdominal masses felt. No tenderness.  No edema to lower extremities. Patient is alert and oriented.     Assessment:    UC which appears to be in remission. Elevated liver enzymes.    Hemiagnioma per MRI. Will continue to montor. Plan:    sedrate, CBC, Hepatic function.  Discuss with Dr. Laural Golden

## 2012-09-07 NOTE — Patient Instructions (Addendum)
Labs. OV in 1 yr.

## 2012-09-16 ENCOUNTER — Encounter (INDEPENDENT_AMBULATORY_CARE_PROVIDER_SITE_OTHER): Payer: Self-pay

## 2012-09-26 ENCOUNTER — Telehealth (INDEPENDENT_AMBULATORY_CARE_PROVIDER_SITE_OTHER): Payer: Self-pay | Admitting: Internal Medicine

## 2012-09-26 NOTE — Telephone Encounter (Signed)
Opened in error

## 2012-10-03 ENCOUNTER — Telehealth (INDEPENDENT_AMBULATORY_CARE_PROVIDER_SITE_OTHER): Payer: Self-pay | Admitting: Internal Medicine

## 2012-10-03 NOTE — Telephone Encounter (Signed)
If liver enyzmes come down we will monitor. If not, she will need a liver biopsy. I discussed with Dr. Laural Golden

## 2012-10-03 NOTE — Telephone Encounter (Signed)
Spoke with patient concerning elevated liver enzymes. I discussed with Dr. Laural Golden.  Will start on Vitamin C 566m and Vitamin E 4042mdaily. Will repeat Hepatic function in 3 months.   Tammy: Hepatic function in 3 months

## 2012-10-04 ENCOUNTER — Telehealth (INDEPENDENT_AMBULATORY_CARE_PROVIDER_SITE_OTHER): Payer: Self-pay | Admitting: *Deleted

## 2012-10-04 DIAGNOSIS — R748 Abnormal levels of other serum enzymes: Secondary | ICD-10-CM

## 2012-10-04 NOTE — Telephone Encounter (Signed)
Lab noted for May 2014

## 2012-10-04 NOTE — Telephone Encounter (Signed)
Per Yvonne [name] repeat labs in 3 months

## 2012-10-06 ENCOUNTER — Other Ambulatory Visit: Payer: Self-pay | Admitting: Internal Medicine

## 2012-10-06 NOTE — Telephone Encounter (Signed)
May we refill this Sir?  Thank you.

## 2012-10-06 NOTE — Telephone Encounter (Signed)
She is now a patient of Dr. Laural Golden - I see that from chart review So do not refill Needs to get Rx from that practice

## 2012-10-07 ENCOUNTER — Other Ambulatory Visit: Payer: Self-pay | Admitting: Internal Medicine

## 2012-12-01 ENCOUNTER — Telehealth (INDEPENDENT_AMBULATORY_CARE_PROVIDER_SITE_OTHER): Payer: Self-pay | Admitting: *Deleted

## 2012-12-01 ENCOUNTER — Encounter (INDEPENDENT_AMBULATORY_CARE_PROVIDER_SITE_OTHER): Payer: Self-pay | Admitting: *Deleted

## 2012-12-01 DIAGNOSIS — R7401 Elevation of levels of liver transaminase levels: Secondary | ICD-10-CM

## 2012-12-01 NOTE — Telephone Encounter (Signed)
Per Deberah Castle, Np the patient will need to have labs

## 2013-01-05 LAB — HEPATIC FUNCTION PANEL
Bilirubin, Direct: 0.1 mg/dL (ref 0.0–0.3)
Indirect Bilirubin: 0.5 mg/dL (ref 0.0–0.9)
Total Bilirubin: 0.6 mg/dL (ref 0.3–1.2)

## 2013-01-12 NOTE — Telephone Encounter (Signed)
LM for Jovan to return the call to schedule a f/u apt.

## 2013-01-19 NOTE — Telephone Encounter (Signed)
LM on home and cell number to return the call to schedule a f/u apt.

## 2013-01-19 NOTE — Telephone Encounter (Signed)
Apt has been scheduled for 04/11/13 at 9:15 am with Dr. Salley Slaughter.

## 2013-04-11 ENCOUNTER — Encounter (INDEPENDENT_AMBULATORY_CARE_PROVIDER_SITE_OTHER): Payer: Self-pay | Admitting: Internal Medicine

## 2013-04-11 ENCOUNTER — Ambulatory Visit (INDEPENDENT_AMBULATORY_CARE_PROVIDER_SITE_OTHER): Payer: BC Managed Care – PPO | Admitting: Internal Medicine

## 2013-04-11 VITALS — BP 108/70 | HR 72 | Temp 98.1°F | Resp 20 | Ht 64.0 in | Wt 129.5 lb

## 2013-04-11 DIAGNOSIS — R7401 Elevation of levels of liver transaminase levels: Secondary | ICD-10-CM

## 2013-04-11 DIAGNOSIS — R7402 Elevation of levels of lactic acid dehydrogenase (LDH): Secondary | ICD-10-CM

## 2013-04-11 DIAGNOSIS — K5190 Ulcerative colitis, unspecified, without complications: Secondary | ICD-10-CM

## 2013-04-11 LAB — CBC WITH DIFFERENTIAL/PLATELET
Eosinophils Absolute: 0 10*3/uL (ref 0.0–0.7)
Eosinophils Relative: 0 % (ref 0–5)
HCT: 36.5 % (ref 36.0–46.0)
Hemoglobin: 12.1 g/dL (ref 12.0–15.0)
Lymphocytes Relative: 18 % (ref 12–46)
Lymphs Abs: 0.8 10*3/uL (ref 0.7–4.0)
MCH: 33.6 pg (ref 26.0–34.0)
MCV: 101.4 fL — ABNORMAL HIGH (ref 78.0–100.0)
Monocytes Relative: 8 % (ref 3–12)
RBC: 3.6 MIL/uL — ABNORMAL LOW (ref 3.87–5.11)

## 2013-04-11 LAB — HEPATIC FUNCTION PANEL
AST: 61 U/L — ABNORMAL HIGH (ref 0–37)
Albumin: 4.4 g/dL (ref 3.5–5.2)
Alkaline Phosphatase: 45 U/L (ref 39–117)
Indirect Bilirubin: 0.7 mg/dL (ref 0.0–0.9)
Total Bilirubin: 0.9 mg/dL (ref 0.3–1.2)
Total Protein: 6.9 g/dL (ref 6.0–8.3)

## 2013-04-11 NOTE — Progress Notes (Addendum)
Presenting complaint;  Followup for UC and elevated transaminases.  Database/Subjective:  Patient is 52 year old Caucasian female who presents for scheduled visit. She was last seen in January 2014 by Ms. Setzer NP. Ulcerative colitis was diagnosed in July 2010 by Dr. Garfield Cornea. She was found to have distal disease. She was intolerant of Lialda and Asacol. She was briefly treated with prednisone. She was subsequently under care of Dr. Silvano Rusk and was begun on 6-MP. She has remained in remission ever since. However she develop mildly elevated transaminases in July 2012 and they have been gradually climbing. On 01/04/2013 her AST was 53 and ALT was 150. On 09/07/2012 her AST was 50 and ALT was 124. Prior workup included normal sedimentation rate, negative ANA normal ferritin levels negative hepatitis B surface antigen and nonreactive hepatitis C virus antibody. Similarly ceruloplasmin level was normal. Initial ultrasound of 12/15/2011 was abnormal revealing gallstones CBD of 9.4 mm hepatic cyst and hyperechoic lesion in the inferior aspect of right lobe. She had another ultrasound in November 2013 followed by MRI of liver on 07/21/2012 revealing gallstones, CBD of 5 mm to hepatic cysts and 2 hemangiomas. Liver was felt to have mild hepatic steatosis. Because of steatosis she was advised to take vitamin C and E. She has no complaints. She has very good appetite. She denies melena or rectal bleeding. She generally passes one formed stool daily. She has flatulence with certain foods but no cramps. She denies fatigue or pruritus. She walks 6-8 miles every week; usually 2 miles at a time. Family history is negative for chronic liver disease. She does not drink alcohol.    Current Medications: Current Outpatient Prescriptions  Medication Sig Dispense Refill  . Biotin 10 MG TABS Take 1 tablet by mouth daily.      . calcium carbonate (OS-CAL) 600 MG TABS Take 1,200 mg by mouth daily.      Marland Kitchen KRILL  OIL 1000 MG CAPS Take 1 capsule by mouth daily.      . mercaptopurine (PURINETHOL) 50 MG tablet Take 1.5 tablets (75 mg total) by mouth daily. Give on an empty stomach 1 hour before or 2 hours after meals. Caution: Chemotherapy.  45 tablet  3  . Red Yeast Rice 600 MG CAPS Take 2 capsules by mouth.      . vitamin C (ASCORBIC ACID) 500 MG tablet Take 500 mg by mouth daily.      . vitamin E 400 UNIT capsule Take 400 Units by mouth daily.       No current facility-administered medications for this visit.     Objective: Blood pressure 108/70, pulse 72, temperature 98.1 F (36.7 C), temperature source Oral, resp. rate 20, height 5' 4"  (1.626 m), weight 129 lb 8 oz (58.741 kg). Patient is alert and in no acute distress. Conjunctiva is pink. Sclera is nonicteric Oropharyngeal mucosa is normal. No neck masses or thyromegaly noted. Cardiac exam with regular rhythm normal S1 and S2. No murmur or gallop noted. Lungs are clear to auscultation. Abdomen is flat and soft. Liver edge is soft below RCM. Spleen is not palpable. No LE edema or clubbing noted.  Labs/studies Results: LFTs from 01/04/2013. Bilirubin oh 0.6, AP 47, AST 53, ALT 150, albumin 4.5.   Assessment:  #1. Distal ulcerative colitis. Disease duration 4 years with initial diagnosis in July of 2010. She remains in remission on 6-MP which she is tolerating very well. #2. Mildly elevated transaminases with gradual rise. I believe mildly elevated transaminases are secondary  to 6-MP or due to hepatic steatosis. She has small hepatic hemangiomas in cysts which have nothing to do with elevated transaminases and she does not need further workup.    Plan:  Patient will go to the lab for CBC with differential and LFTs. If transaminases remain elevated would consider dropping 6-MP dose to 50 mg daily. If transaminases do not return to normal she may may have to be switched to another medication for maintenance of UC.

## 2013-04-11 NOTE — Patient Instructions (Signed)
Physician will contact her with results of blood work and further recommendations.

## 2013-04-18 ENCOUNTER — Telehealth (INDEPENDENT_AMBULATORY_CARE_PROVIDER_SITE_OTHER): Payer: Self-pay | Admitting: *Deleted

## 2013-04-18 DIAGNOSIS — R7401 Elevation of levels of liver transaminase levels: Secondary | ICD-10-CM

## 2013-04-18 NOTE — Telephone Encounter (Signed)
Per Dr.Rehman the patient will need to have labs drawn in 5 weeks.

## 2013-05-13 IMAGING — US US ABDOMEN COMPLETE
1 series · 13 of 25 positions shown · non-contrast
Comparison: None.

CLINICAL DATA: Elevated liver enzymes

COMPLETE ABDOMINAL ULTRASOUND

[Series 1: us abdomen complete · 0.16mm/px · 13 of 116 slices shown]
[im 1/116]
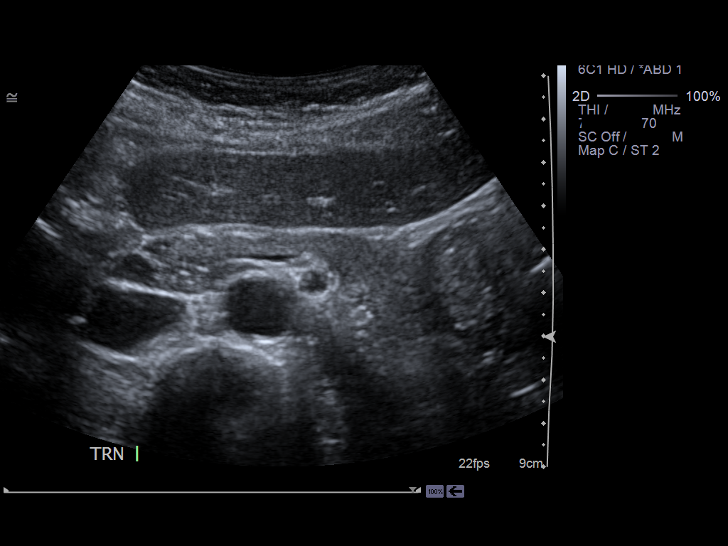
[im 10/116]
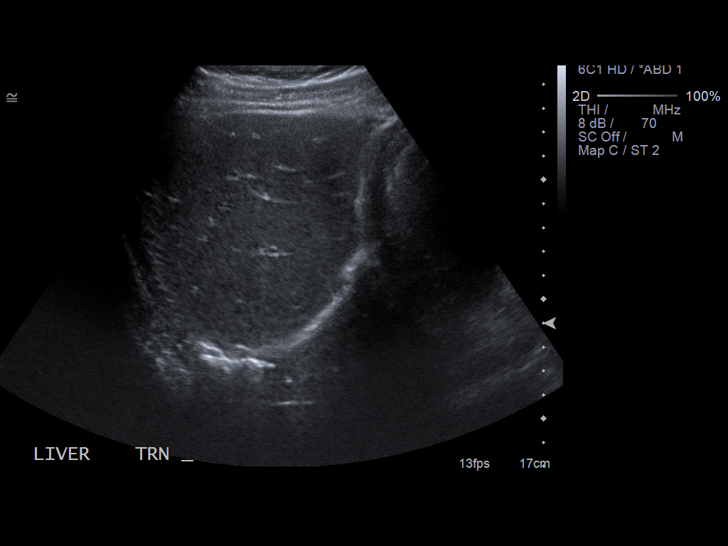
[im 20/116]
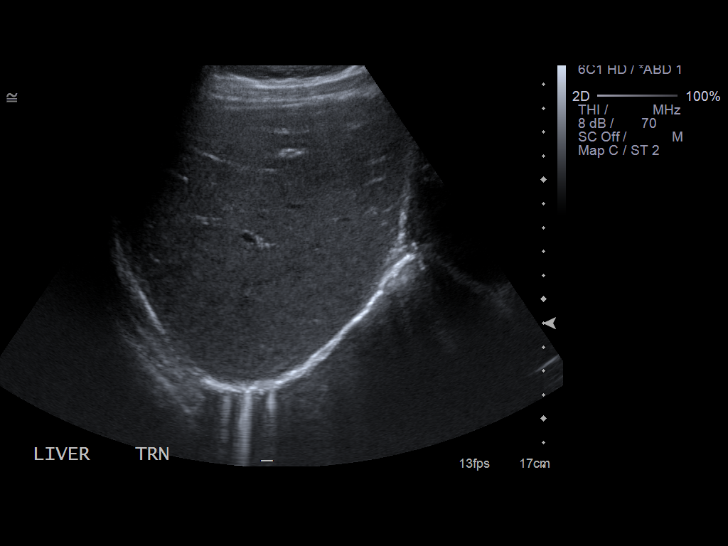
[im 29/116]
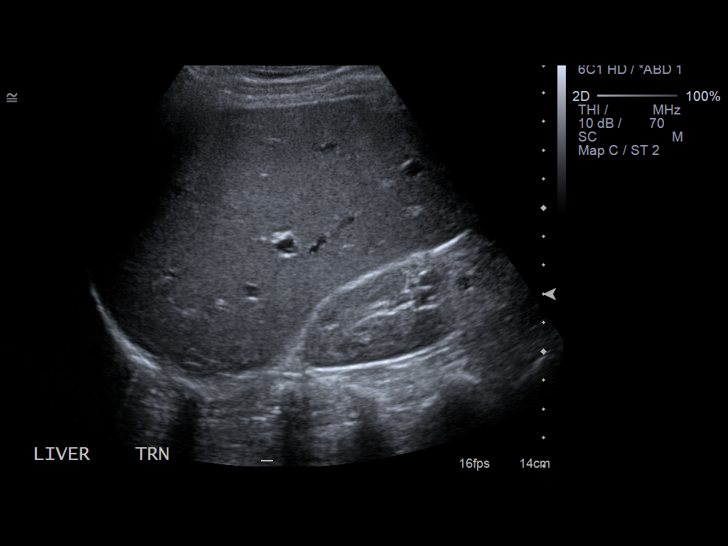
[im 39/116]
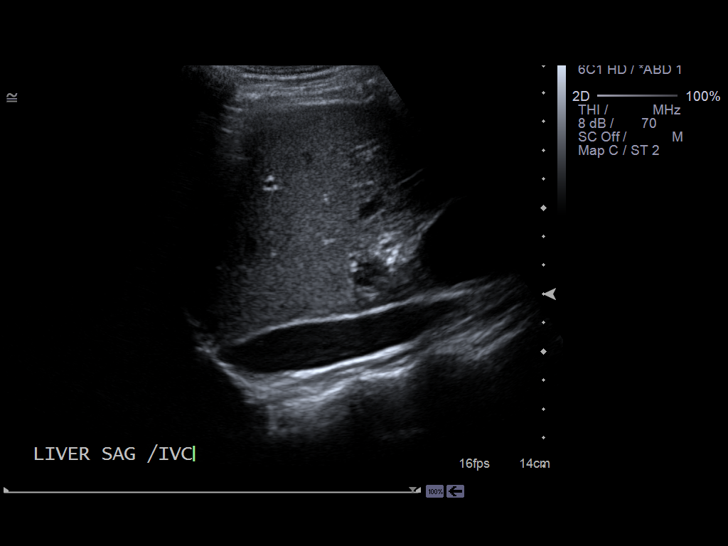
[im 48/116]
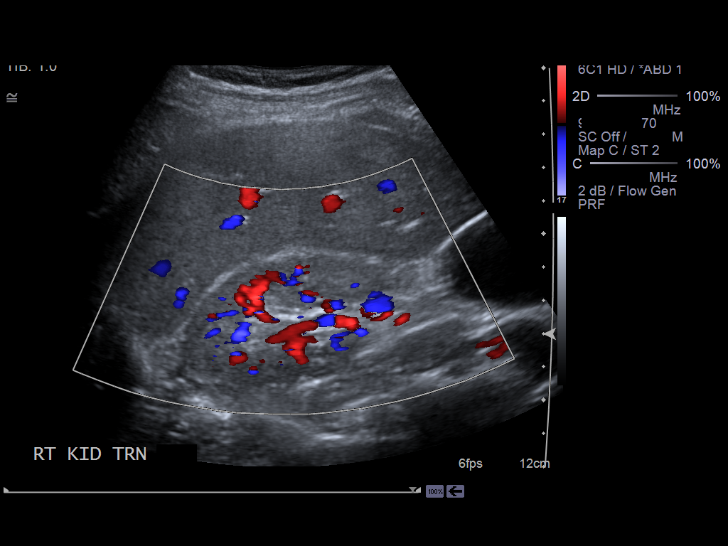
[im 58/116]
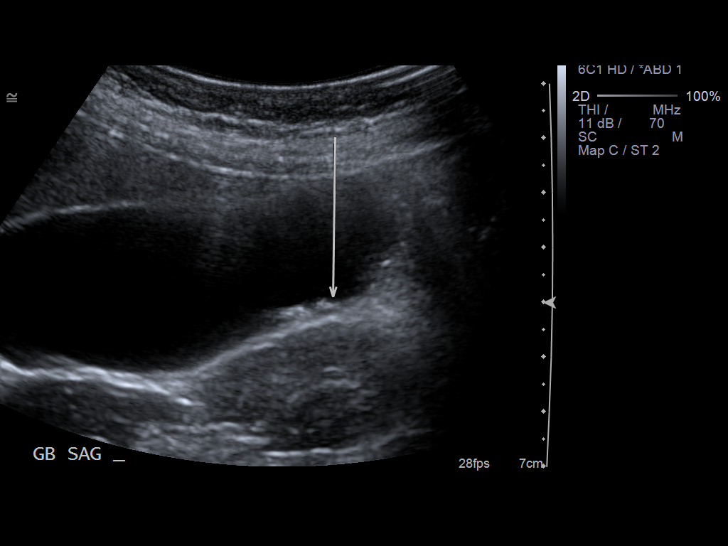
[im 68/116]
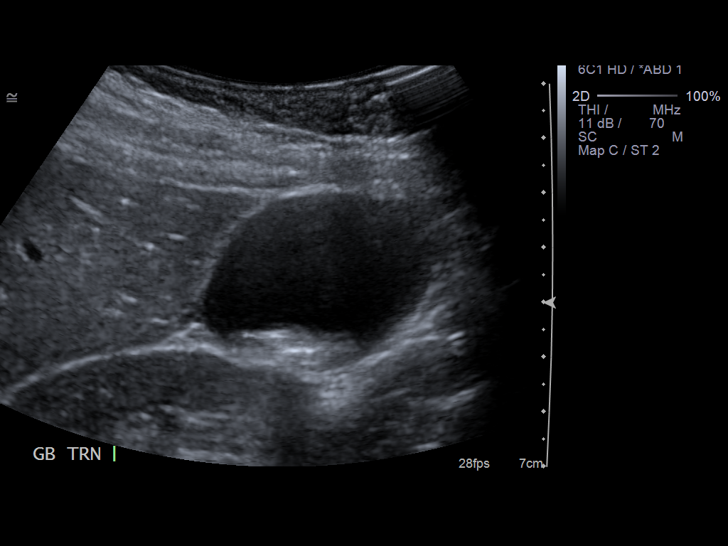
[im 77/116]
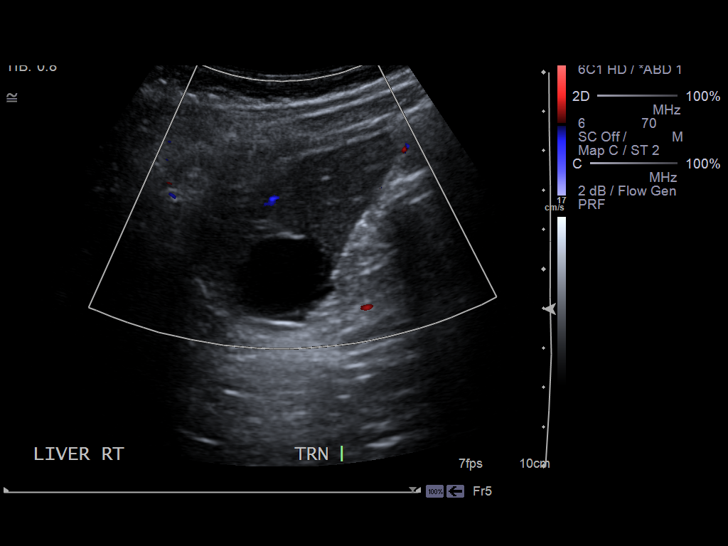
[im 87/116]
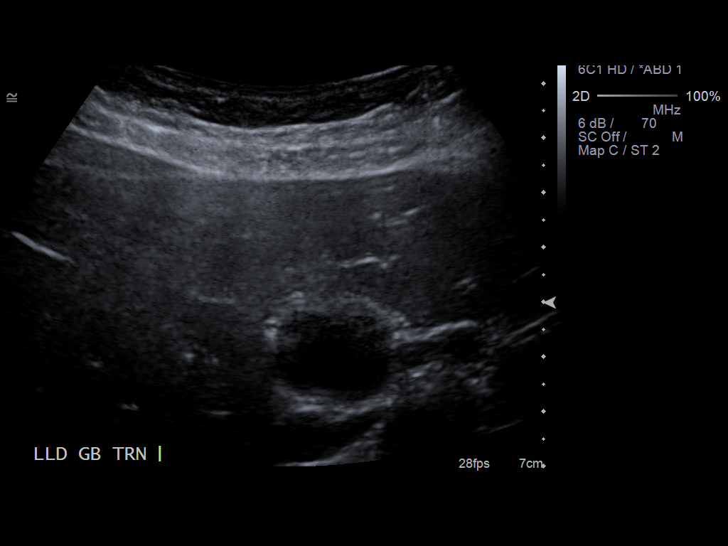
[im 96/116]
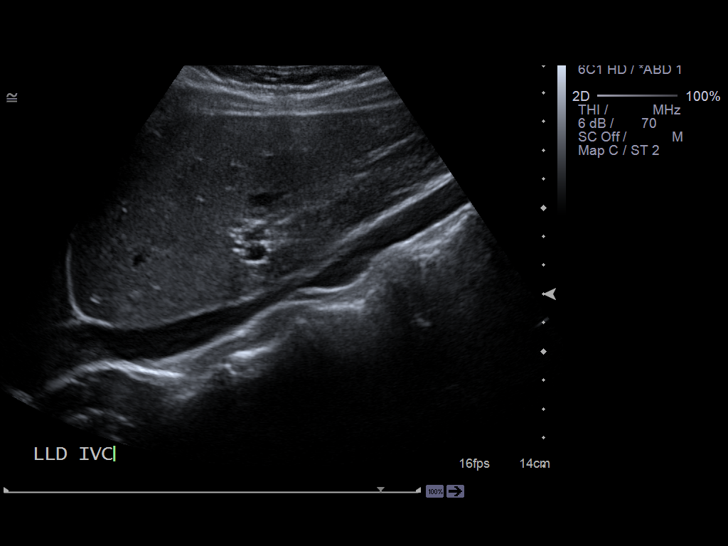
[im 106/116]
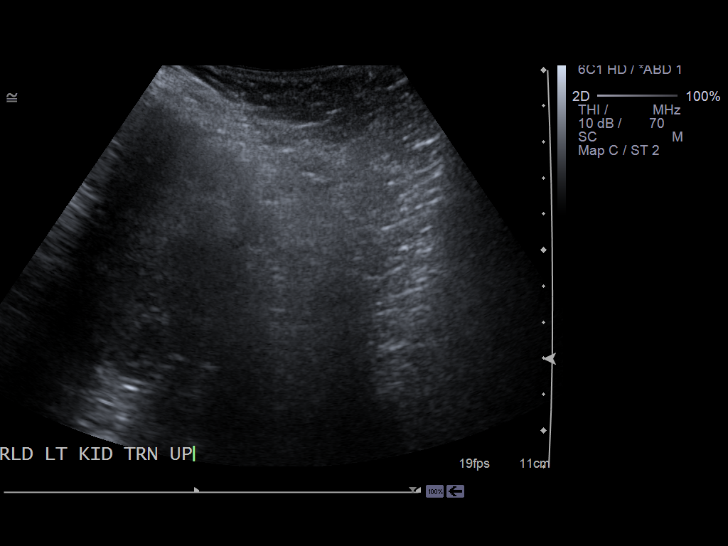
[im 116/116]
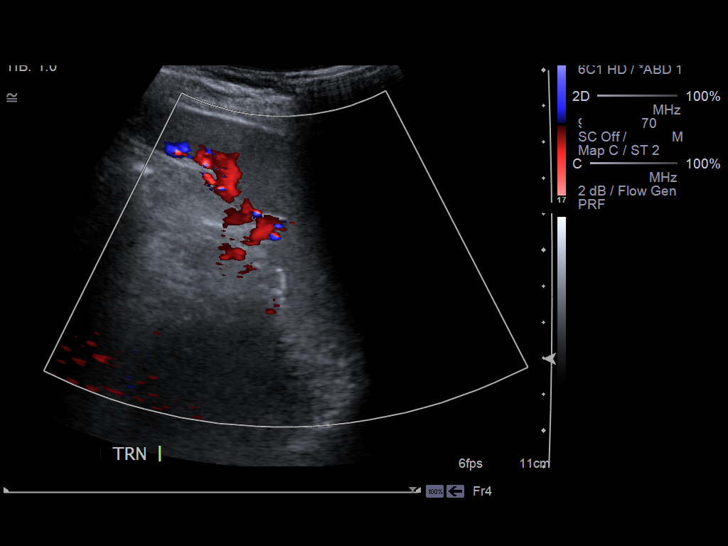

[13 of 25 positions shown; findings below may reference images not displayed]

FINDINGS: Gallbladder:  There is a mobile gallstone within gallbladder
measures 1.2 cm.  No thickening of gallbladder wall.  No
sonographic Murphy's sign.

Common bile duct:  Measures 9.4 mm in diameter prominent in size.

Liver:  Liver shows no intrahepatic biliary ductal dilatation.
There is a cyst in the right hepatic lobe measures 2.7 x 2.5 cm.
Hyper echoic lesion in the inferior aspect of the right hepatic
lobe measures 2.6 x 2.7 cm.  Statistically most likely represents
hemangioma.  Confirmation with CT scan with IV contrast could be
performed.

IVC:  Appears normal.

Pancreas:  No focal abnormality seen.

Spleen:  Measures 7.4 cm in length.  Normal echogenicity.

Right Kidney:  Measures 11 cm in length.  No mass, hydronephrosis
or diagnostic renal calculus

Left Kidney:  Measures 9.5 cm in length.  No mass, hydronephrosis
or diagnostic renal calculus

Abdominal aorta:  No aneurysm identified. Measures up to 1.7 cm in
diameter.
IMPRESSION: 1.  There is a mobile gallstone within gallbladder measures 1.2 cm.
2.  Prominent size CBD measures 9.4 mm in diameter.
3.  A cyst in right hepatic lobe measures 2.5 x 2.7 cm.
Hyperechoic lesion in the inferior aspect of the right hepatic lobe
measures 2.7 cm.  Statistically most likely represents a
hemangioma.  Confirmation with CT scan with IV contrast could be
performed.
4.  No hydronephrosis or diagnostic renal calculus.

## 2013-05-17 ENCOUNTER — Other Ambulatory Visit (INDEPENDENT_AMBULATORY_CARE_PROVIDER_SITE_OTHER): Payer: Self-pay | Admitting: *Deleted

## 2013-05-17 ENCOUNTER — Encounter (INDEPENDENT_AMBULATORY_CARE_PROVIDER_SITE_OTHER): Payer: Self-pay | Admitting: *Deleted

## 2013-05-17 DIAGNOSIS — R7401 Elevation of levels of liver transaminase levels: Secondary | ICD-10-CM

## 2013-05-27 LAB — HEPATIC FUNCTION PANEL
ALT: 210 U/L — ABNORMAL HIGH (ref 0–35)
Albumin: 4.2 g/dL (ref 3.5–5.2)
Alkaline Phosphatase: 36 U/L — ABNORMAL LOW (ref 39–117)
Total Protein: 6.7 g/dL (ref 6.0–8.3)

## 2013-06-03 ENCOUNTER — Other Ambulatory Visit (INDEPENDENT_AMBULATORY_CARE_PROVIDER_SITE_OTHER): Payer: Self-pay | Admitting: Internal Medicine

## 2013-06-03 MED ORDER — MESALAMINE 400 MG PO CPDR: 1600.0000 mg | DELAYED_RELEASE_CAPSULE | Freq: Two times a day (BID) | ORAL | Status: DC

## 2013-06-05 ENCOUNTER — Encounter (INDEPENDENT_AMBULATORY_CARE_PROVIDER_SITE_OTHER): Payer: Self-pay | Admitting: Internal Medicine

## 2013-06-05 ENCOUNTER — Telehealth (INDEPENDENT_AMBULATORY_CARE_PROVIDER_SITE_OTHER): Payer: Self-pay | Admitting: *Deleted

## 2013-06-05 DIAGNOSIS — R7401 Elevation of levels of liver transaminase levels: Secondary | ICD-10-CM

## 2013-06-05 NOTE — Telephone Encounter (Signed)
Per Dr.Rehman the patient will need to have labs drawn in 4 weeks.

## 2013-06-16 ENCOUNTER — Encounter (INDEPENDENT_AMBULATORY_CARE_PROVIDER_SITE_OTHER): Payer: Self-pay | Admitting: Internal Medicine

## 2013-06-22 ENCOUNTER — Telehealth (INDEPENDENT_AMBULATORY_CARE_PROVIDER_SITE_OTHER): Payer: Self-pay | Admitting: *Deleted

## 2013-06-22 DIAGNOSIS — R7401 Elevation of levels of liver transaminase levels: Secondary | ICD-10-CM

## 2013-06-22 NOTE — Telephone Encounter (Signed)
Patient will have her lab work drawn in 1 month, as she will start the Delizcol on 06/23/13. Lab noted for 07-23-13. Patient is aware but a letter will be sent to her as a reminder of the lab.

## 2013-06-29 ENCOUNTER — Telehealth (INDEPENDENT_AMBULATORY_CARE_PROVIDER_SITE_OTHER): Payer: Self-pay | Admitting: *Deleted

## 2013-06-29 NOTE — Telephone Encounter (Signed)
Yvonne [name] states that she had been on the 6 MP for 4 years , and Dr.Rehman did the decrease in the medication and she says that she noticed abdominal cramping as the doses went down. She says that she stopped the  6 MP  and started the Delzicol ,and the diarrhea started the very next day.Expereinced some abdominal (Lower) pain. She has also seen small red pieces of the tablet in her stool. She is having to make herself eat as she has no appetite. Patient really wants this to work, she just wonders if her body is adjusting to not having the 6 MP? She has not taken any antibiotics. Patient will do as Dr.Rehman has recommended and will call us if she needs too, patient was told that she may take the Imodium up to 3 times a day. Forwarded to [place] as an Pharmacist, hospital.

## 2013-06-29 NOTE — Telephone Encounter (Signed)
The new medication that was given to her is causing diarrhea. It started 06/24/13 and is not having abd pain. Her stools is not bloody at this time. The return phone number is [phone].

## 2013-06-29 NOTE — Telephone Encounter (Signed)
Per Dr.Rehman the patient may take Imodium by mouth twice a day. If she should develop a fever or notes rectal bleeding , and worsened diarrhea she is to call our office. Also we need to make sure that she has not taken antibiotics , if so we will need to do a C-Diff. Patient called and advised of Dr.Rehman's recommendations.

## 2013-07-01 ENCOUNTER — Telehealth (INDEPENDENT_AMBULATORY_CARE_PROVIDER_SITE_OTHER): Payer: Self-pay | Admitting: Internal Medicine

## 2013-07-01 MED ORDER — PREDNISONE 10 MG PO TABS: 30.0000 mg | ORAL_TABLET | Freq: Every day | ORAL | Status: DC

## 2013-07-01 NOTE — Telephone Encounter (Signed)
Patient called this morning stating she is having diarrhea and lower abdominal pain. She had similar symptoms when she was diagnosed with UC. 6-MP was discontinued because of hepatocellular injury and she was just begun on Delzicol. Patient begun on prednisone 30 mg every morning. She will taper dose of 5 mg every week. Patient needs LFTs end of next week.

## 2013-07-03 ENCOUNTER — Telehealth (INDEPENDENT_AMBULATORY_CARE_PROVIDER_SITE_OTHER): Payer: Self-pay | Admitting: *Deleted

## 2013-07-03 DIAGNOSIS — R748 Abnormal levels of other serum enzymes: Secondary | ICD-10-CM

## 2013-07-03 NOTE — Telephone Encounter (Signed)
Already addressed

## 2013-07-03 NOTE — Telephone Encounter (Signed)
Lab has been noted and the patient will be called and made aware of the date.

## 2013-07-03 NOTE — Telephone Encounter (Signed)
Per Dr.Rehman the patient will need to have labs drawn at the end of the week, 07/07/13.

## 2013-07-05 ENCOUNTER — Encounter (INDEPENDENT_AMBULATORY_CARE_PROVIDER_SITE_OTHER): Payer: Self-pay | Admitting: *Deleted

## 2013-07-05 ENCOUNTER — Other Ambulatory Visit (INDEPENDENT_AMBULATORY_CARE_PROVIDER_SITE_OTHER): Payer: Self-pay | Admitting: *Deleted

## 2013-07-05 DIAGNOSIS — R7401 Elevation of levels of liver transaminase levels: Secondary | ICD-10-CM

## 2013-07-07 LAB — HEPATIC FUNCTION PANEL
Indirect Bilirubin: 0.2 mg/dL (ref 0.0–0.9)
Total Protein: 6.9 g/dL (ref 6.0–8.3)

## 2013-07-12 NOTE — Progress Notes (Signed)
Apt has been scheduled for 09/05/12 for a 2 month f/u.

## 2013-07-17 ENCOUNTER — Telehealth (INDEPENDENT_AMBULATORY_CARE_PROVIDER_SITE_OTHER): Payer: Self-pay | Admitting: *Deleted

## 2013-07-17 NOTE — Telephone Encounter (Signed)
Was told by Dr. Laural Golden to give Yvonne [name] a call Monday, 07/17/13, about her prednisone. She is currently taking 3 pills (30 mg) every morning. It is time to reduce the dose and would like to know how much. Her return phone number is [phone].

## 2013-07-17 NOTE — Telephone Encounter (Signed)
Per Dr.Rehman the patient may drop it by 5 mg weekly. Starting tomorrow. Patient was called and made aware.

## 2013-08-25 ENCOUNTER — Telehealth (INDEPENDENT_AMBULATORY_CARE_PROVIDER_SITE_OTHER): Payer: Self-pay | Admitting: *Deleted

## 2013-08-25 ENCOUNTER — Other Ambulatory Visit (INDEPENDENT_AMBULATORY_CARE_PROVIDER_SITE_OTHER): Payer: Self-pay | Admitting: Internal Medicine

## 2013-08-25 NOTE — Telephone Encounter (Signed)
Has been taking Delzicol 400 mg (takes 8 pills daily) for about 1 month and her diarrhea is no better. She is in a lot of pain and rectum is swollen. Would like to know if there is something else she can try or a suppository to help shrink the inflammation? Her return phone number [phone] and [phone].

## 2013-08-25 NOTE — Telephone Encounter (Signed)
I have called an Rx in to [facility] for Hydrocortisone enemas x 2 weeks to see how she does.

## 2013-08-29 ENCOUNTER — Encounter (INDEPENDENT_AMBULATORY_CARE_PROVIDER_SITE_OTHER): Payer: Self-pay | Admitting: Internal Medicine

## 2013-09-05 ENCOUNTER — Ambulatory Visit (INDEPENDENT_AMBULATORY_CARE_PROVIDER_SITE_OTHER): Payer: BC Managed Care – PPO | Admitting: Internal Medicine

## 2013-09-05 ENCOUNTER — Encounter (INDEPENDENT_AMBULATORY_CARE_PROVIDER_SITE_OTHER): Payer: Self-pay | Admitting: Internal Medicine

## 2013-09-05 VITALS — BP 108/70 | HR 78 | Temp 98.8°F | Resp 18 | Ht 64.0 in | Wt 121.5 lb

## 2013-09-05 DIAGNOSIS — R7402 Elevation of levels of lactic acid dehydrogenase (LDH): Secondary | ICD-10-CM

## 2013-09-05 DIAGNOSIS — R740 Nonspecific elevation of levels of transaminase and lactic acid dehydrogenase [LDH]: Principal | ICD-10-CM

## 2013-09-05 DIAGNOSIS — K5190 Ulcerative colitis, unspecified, without complications: Secondary | ICD-10-CM

## 2013-09-05 DIAGNOSIS — R7401 Elevation of levels of liver transaminase levels: Secondary | ICD-10-CM

## 2013-09-05 LAB — CBC
HCT: 37 % (ref 36.0–46.0)
HEMOGLOBIN: 12.3 g/dL (ref 12.0–15.0)
MCH: 30.8 pg (ref 26.0–34.0)
MCHC: 33.2 g/dL (ref 30.0–36.0)
MCV: 92.5 fL (ref 78.0–100.0)
PLATELETS: 535 10*3/uL — AB (ref 150–400)
RBC: 4 MIL/uL (ref 3.87–5.11)
RDW: 13.9 % (ref 11.5–15.5)
WBC: 10.6 10*3/uL — ABNORMAL HIGH (ref 4.0–10.5)

## 2013-09-05 MED ORDER — PREDNISONE 10 MG PO TABS: 30.0000 mg | ORAL_TABLET | Freq: Every day | ORAL | Status: DC

## 2013-09-05 MED ORDER — KRILL OIL 1000 MG PO CAPS: 1.0000 | ORAL_CAPSULE | Freq: Two times a day (BID) | ORAL | Status: AC

## 2013-09-05 NOTE — Progress Notes (Signed)
Presenting complaint;  Followup for ulcerative colitis and elevated transaminases.  Subjective:  Patient has  4 year history of distal ulcerative colitis. She has been in remission with 6-MP but developed elevated transaminases. 6-MP was discontinued on 06/03/2013 and she was begun on oral mesalamine. She has been on Delzicol 1.6 g by mouth twice a day. She has not been doing well for well over a week. She has diarrhea with mucus and some bleeding. She has an average of 6 stools per day and most of these were 3-4 occurred during the night. She has pain across the lower abdomen as well as tenesmus. She has no energy and poor appetite. She has lost 8 pounds in the last few months. She denies fever chills nausea or vomiting. Patient had been using Hydrocort enemas for the last 10 days but cannot tell a difference. Prednisone prescription was called but was not filled due to miscommunication.   Current Medications: Current Outpatient Prescriptions  Medication Sig Dispense Refill  . Biotin 10 MG TABS Take 1 tablet by mouth daily.      . calcium carbonate (OS-CAL) 600 MG TABS Take 1,200 mg by mouth daily.      . COLOCORT 100 MG/60ML enema Place 1 enema rectally at bedtime.       Marland Kitchen KRILL OIL 1000 MG CAPS Take 1 capsule by mouth daily.      . Mesalamine (DELZICOL) 400 MG CPDR DR capsule Take 400 mg by mouth. Takes 4 by mouth twice a day      . Red Yeast Rice 600 MG CAPS Take 2 capsules by mouth.      . vitamin C (ASCORBIC ACID) 500 MG tablet Take 500 mg by mouth daily.      . vitamin E 400 UNIT capsule Take 400 Units by mouth daily.       No current facility-administered medications for this visit.     Objective: Blood pressure 108/70, pulse 78, temperature 98.8 F (37.1 C), temperature source Oral, resp. rate 18, height 5' 4"  (1.626 m), weight 121 lb 8 oz (55.112 kg). Patient is alert and in no acute distress. Conjunctiva is pink. Sclera is nonicteric Oropharyngeal mucosa is normal. No  neck masses or thyromegaly noted. Cardiac exam with regular rhythm normal S1 and S2. No murmur or gallop noted. Lungs are clear to auscultation. Abdomen is flat and soft with mild tenderness her hypogastric region. No organomegaly or masses.  No LE edema or clubbing noted.    Assessment:  #1. Ulcerative colitis. Unfortunately her disease her relapsed and not responding to oral mesalamine which she has been taking for 10 weeks. 6-MP at work but was discontinued because of elevated transaminases. #2. Elevated transaminases. There's been continued improvement in AST and ALT level since she's been off 6-MP.  Plan:  Prednisone 30 mg by mouth daily for one to 2 weeks and thereafter drop dose by 5 mg every week. Continue Delzicol at 1.6 g by mouth twice a day. Patient will go the lab for CBC, CRP and LFTs. We also discussed other treatment options including methotrexate, biologic therapy as well as surgery which would be curative. Office visit in 4 weeks.

## 2013-09-05 NOTE — Patient Instructions (Addendum)
Prednisone 30 mg by mouth daily for one to two weeks and then drop dose by 5 mg every week. Physician will contact you with results of blood work.

## 2013-09-06 ENCOUNTER — Telehealth (INDEPENDENT_AMBULATORY_CARE_PROVIDER_SITE_OTHER): Payer: Self-pay | Admitting: *Deleted

## 2013-09-06 LAB — HEPATIC FUNCTION PANEL
ALBUMIN: 4.2 g/dL (ref 3.5–5.2)
ALK PHOS: 67 U/L (ref 39–117)
ALT: 24 U/L (ref 0–35)
AST: 22 U/L (ref 0–37)
BILIRUBIN DIRECT: 0.1 mg/dL (ref 0.0–0.3)
BILIRUBIN INDIRECT: 0.2 mg/dL (ref 0.0–0.9)
BILIRUBIN TOTAL: 0.3 mg/dL (ref 0.3–1.2)
Total Protein: 7.6 g/dL (ref 6.0–8.3)

## 2013-09-06 LAB — C-REACTIVE PROTEIN: CRP: 0.8 mg/dL — ABNORMAL HIGH (ref <0.60)

## 2013-09-06 NOTE — Telephone Encounter (Signed)
Patient was seen in the office on 09-05-13. Patient several years ago was put on Lialda following a Colonoscopy.  She has only took this a few days when she experienced chest pain ,pain in left arm. This happened while she was at an orientation with her son at [place]. When taken to the ED , she was ask what had she tried new. She told them that she had just started taking Lialda ,and it was thought that she was intolerant/allergic to this mediation and it caused the palpitations. Patient stopped the medication. While at her appointment yesterday , she expressed that she really wasn't sure if that was correct, could have ben the anxiety of son's graduation from high school and being at [facility], for orientation with all those people. This was shared with Dr.Rehman , who had ask that the patient read and study on these following medications, Methotrexate, Remicade, Humira for the treatment of her Ulcerative Colitis. He stated that if the patient felt okay with this , that we could start the Lialda back, her taking 1 a day for 3 days, then increase to 2 a day for 3 days , then 3 a day for 3 days , followed by taking 4 a day , which is the recommended dose for this medication. Patient called this morning, and the above information shared with her. She is in agreement to try this to see if she is definitely intolerant to the [place]. She mentioned this morning that also at the time she experienced the palpations she had been placed on Cholesterol Medication,which she is no longer taking. Patient picking up samples today , and will call us with a progress report.

## 2013-09-11 NOTE — Progress Notes (Signed)
Already has apt on 10/17/13 with Dr. Laural Golden.

## 2013-10-10 ENCOUNTER — Ambulatory Visit (INDEPENDENT_AMBULATORY_CARE_PROVIDER_SITE_OTHER): Payer: BC Managed Care – PPO | Admitting: Internal Medicine

## 2013-10-17 ENCOUNTER — Ambulatory Visit (INDEPENDENT_AMBULATORY_CARE_PROVIDER_SITE_OTHER): Payer: BC Managed Care – PPO | Admitting: Internal Medicine

## 2013-10-17 ENCOUNTER — Encounter (INDEPENDENT_AMBULATORY_CARE_PROVIDER_SITE_OTHER): Payer: Self-pay | Admitting: Internal Medicine

## 2013-10-17 VITALS — BP 120/80 | HR 82 | Temp 98.0°F | Resp 18 | Ht 64.0 in | Wt 123.8 lb

## 2013-10-17 DIAGNOSIS — K5190 Ulcerative colitis, unspecified, without complications: Secondary | ICD-10-CM

## 2013-10-17 LAB — CBC
HEMATOCRIT: 38.7 % (ref 36.0–46.0)
HEMOGLOBIN: 12.6 g/dL (ref 12.0–15.0)
MCH: 30 pg (ref 26.0–34.0)
MCHC: 32.6 g/dL (ref 30.0–36.0)
MCV: 92.1 fL (ref 78.0–100.0)
Platelets: 413 10*3/uL — ABNORMAL HIGH (ref 150–400)
RBC: 4.2 MIL/uL (ref 3.87–5.11)
RDW: 13.2 % (ref 11.5–15.5)
WBC: 9.7 10*3/uL (ref 4.0–10.5)

## 2013-10-17 LAB — C-REACTIVE PROTEIN: CRP: <0.5 mg/dL (ref <0.60)

## 2013-10-17 MED ORDER — MESALAMINE 1.2 G PO TBEC: 2.4000 g | DELAYED_RELEASE_TABLET | Freq: Two times a day (BID) | ORAL | Status: DC

## 2013-10-17 NOTE — Progress Notes (Signed)
Presenting complaint;  Followup for UC and elevated transaminases.  Subjective:  Patient is 53 year old Caucasian female who has chronic UC and has done well the 6-MP which was discontinued about 4 months ago for elevated transaminases. Off therapy she developed relapse and treated with prednisone and begun on oral mesalamine. She was last seen on 09/05/2013. She states she is doing much better. She is having 2 formed stools daily. Prednisone dose is down to 5 mg a day. She is not experiencing any side effects with Lialda. Her appetite is very good and she denies abdominal pain. She has not experienced any side effects of prednisone other than having great appetite.  Current Medications: Current Outpatient Prescriptions  Medication Sig Dispense Refill  . Biotin 10 MG TABS Take 1 tablet by mouth daily.      . calcium carbonate (OS-CAL) 600 MG TABS Take 1,200 mg by mouth daily.      . Coenzyme Q10 (CO Q 10 PO) Take by mouth daily.      Javier Docker Oil 1000 MG CAPS Take 1 capsule (1,000 mg total) by mouth 2 (two) times daily.      . mesalamine (LIALDA) 1.2 G EC tablet Take 1.2 g by mouth daily with breakfast. Patient is taking 4 by mouth daily.      . predniSONE (DELTASONE) 10 MG tablet Take 3 tablets (30 mg total) by mouth daily with breakfast.  100 tablet  0  . Red Yeast Rice 600 MG CAPS Take 2 capsules by mouth.       No current facility-administered medications for this visit.     Objective: Blood pressure 120/80, pulse 82, temperature 98 F (36.7 C), temperature source Oral, resp. rate 18, height 5' 4"  (1.626 m), weight 123 lb 12.8 oz (56.155 kg). Patient is alert and in no acute distress. Conjunctiva is pink. Sclera is nonicteric Oropharyngeal mucosa is normal. No neck masses or thyromegaly noted. Cardiac exam with regular rhythm normal S1 and S2. No murmur or gallop noted. Lungs are clear to auscultation. Abdomen is flat soft and nontender without organomegaly or masses. No LE  edema or clubbing noted.  Labs/studies Results: Lab data from 09/05/2013. WBC 10.6, H&H 12.3 and 37 and platelet count 535K. CRP 0.8(less than 0.6). Bilirubin 0.3, AP 67, AST 22, ALT 24, total protein 7.6 and albumin 4.2.   Assessment:  #1. Ulcerative colitis. She appears to be back in remission but lab studies from over 5 weeks ago revealed mildly elevated CRP and thrombocytosis. #2. History of elevated transaminases secondary to 6-MP. Transaminases are normal normal.   Plan: Patient will go to the lab for CBC and CRP. Continue prednisone at 5 mg by mouth daily until further notice. Continue Lialda at 2.4 g by mouth twice a day; new prescription sent to her pharmacy. Office visit in 6 months.

## 2013-10-17 NOTE — Patient Instructions (Signed)
Physician will contact you with results of blood and further recommendations

## 2013-12-13 IMAGING — US US ABDOMEN LIMITED
1 series · 13 of 25 positions shown · non-contrast
Comparison: Ultrasound examination December 18, 2011

Ultrasound right upper quadrant
HISTORY: Previous liver mass

[Series 1: us abdomen limited · 0.20mm/px · 13 of 98 slices shown]
[im 1/98]
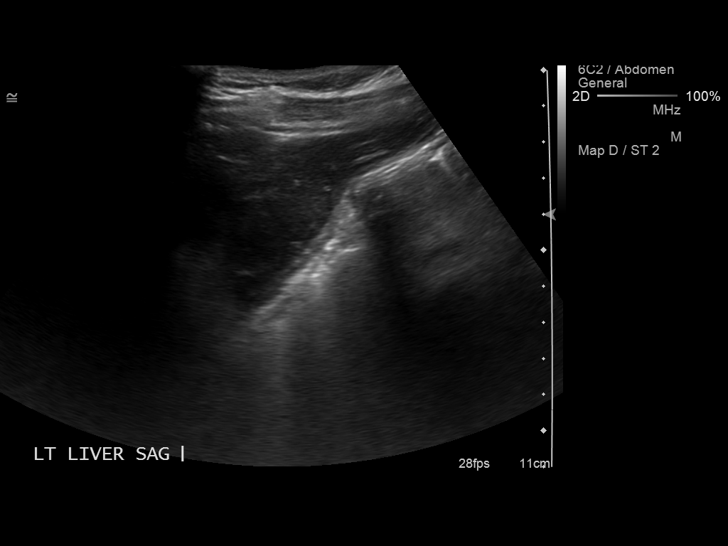
[im 9/98]
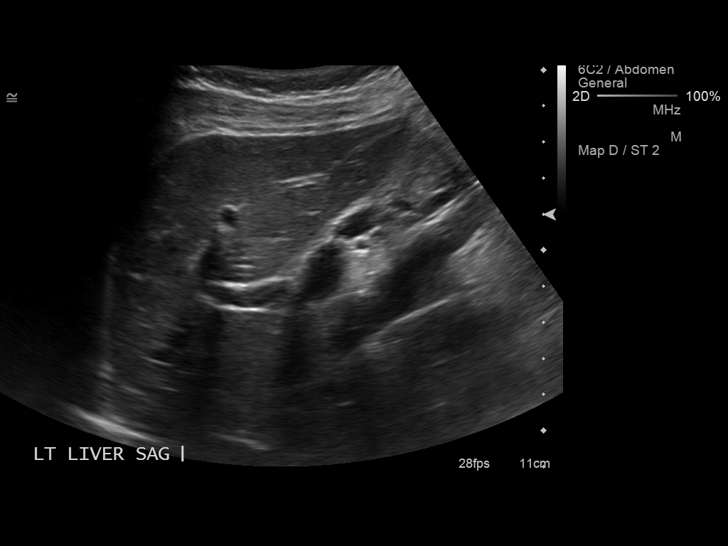
[im 17/98]
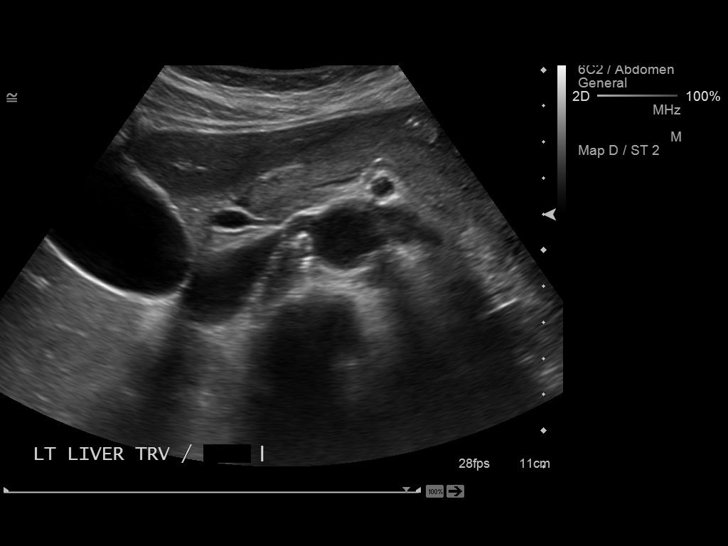
[im 25/98]
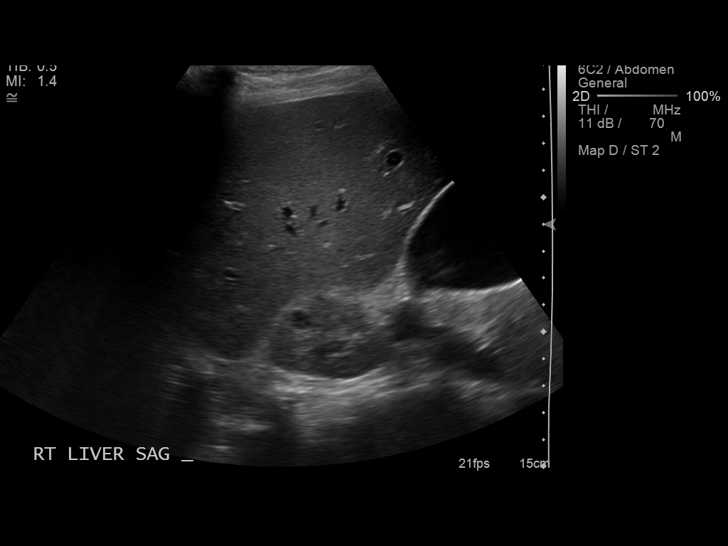
[im 33/98]
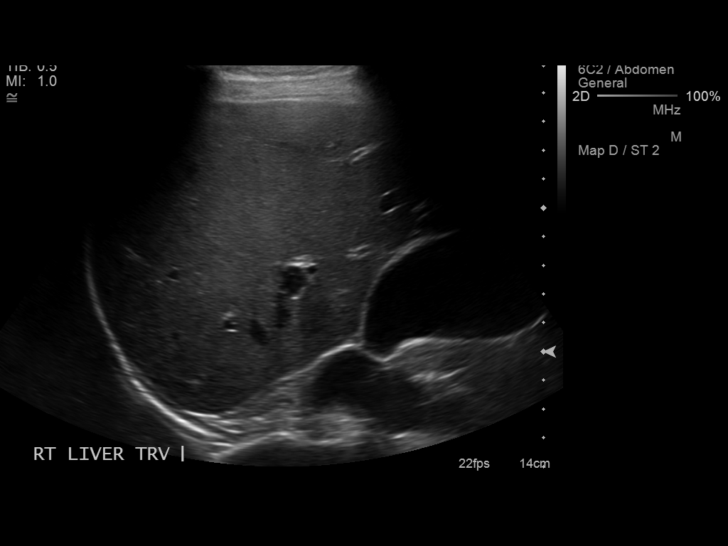
[im 41/98]
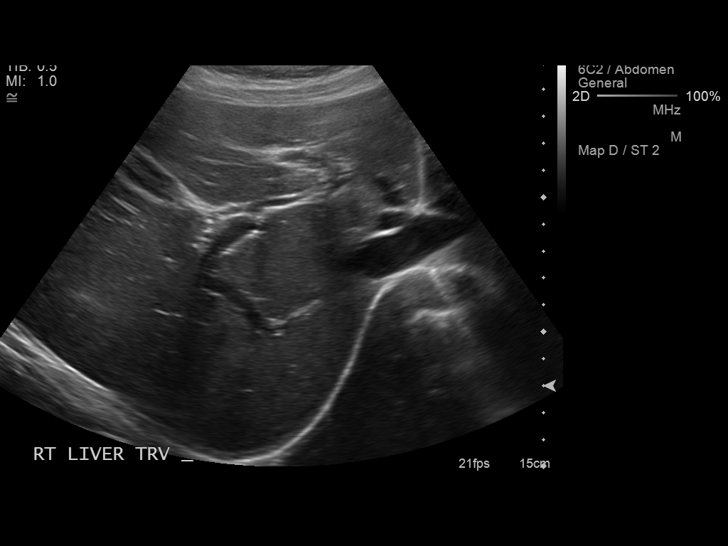
[im 49/98]
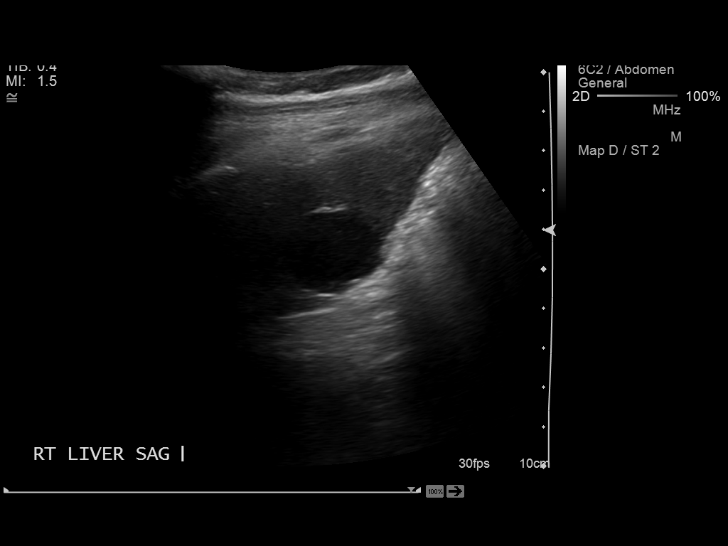
[im 57/98]
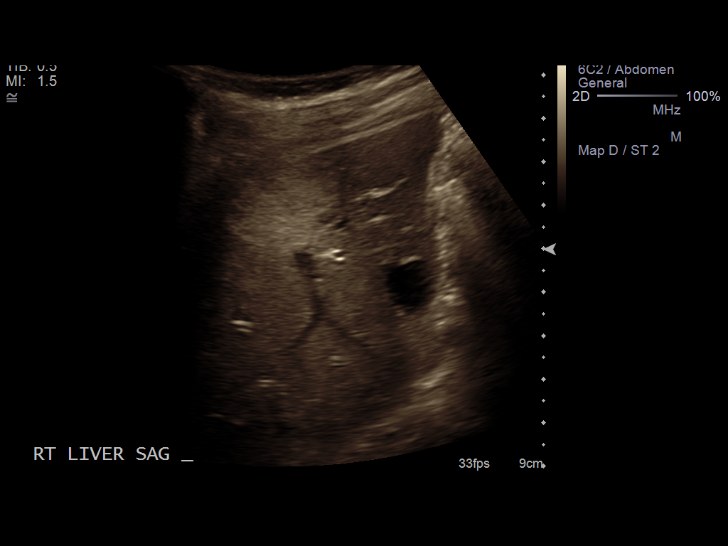
[im 65/98]
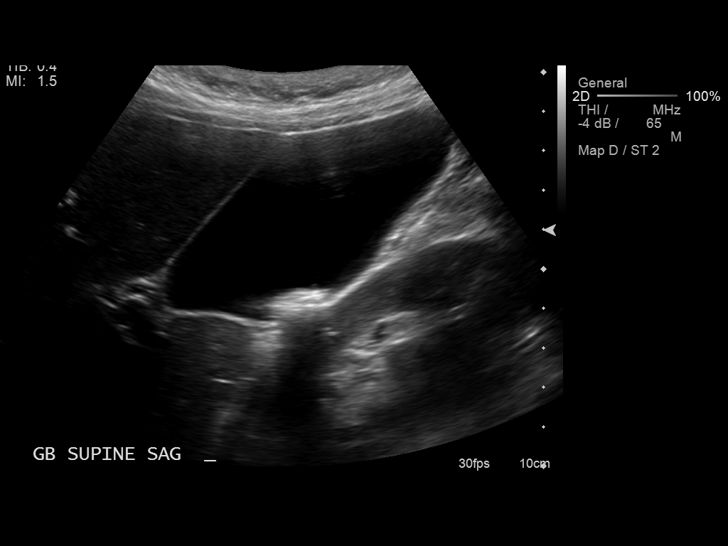
[im 73/98]
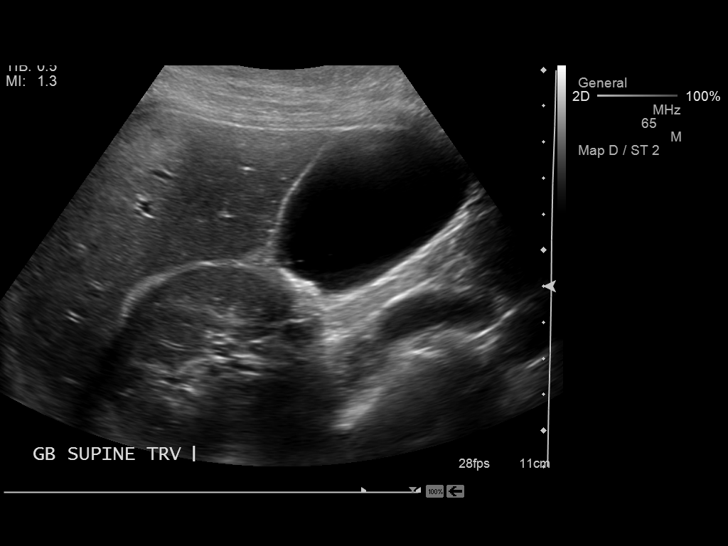
[im 81/98]
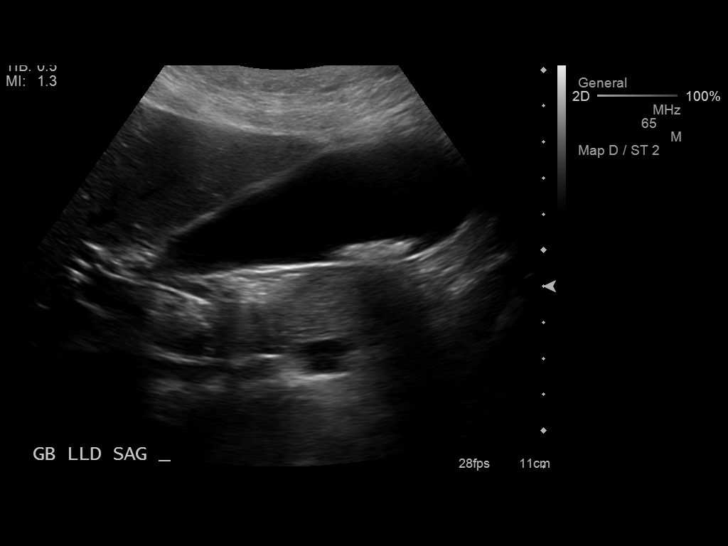
[im 89/98]
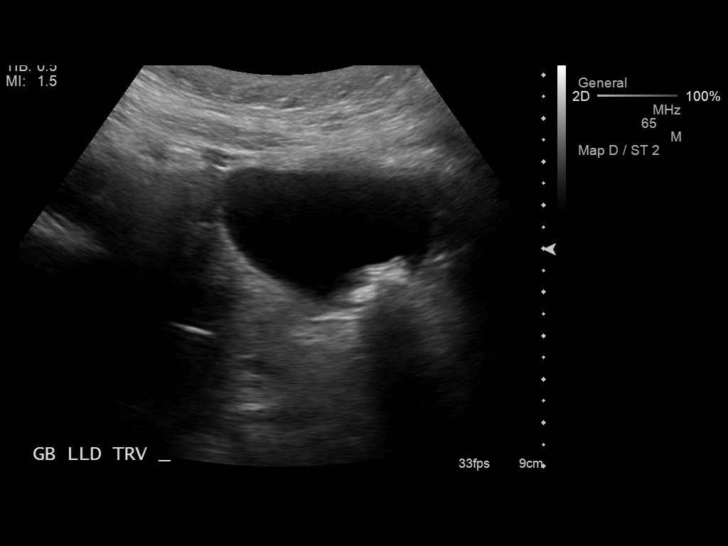
[im 98/98]
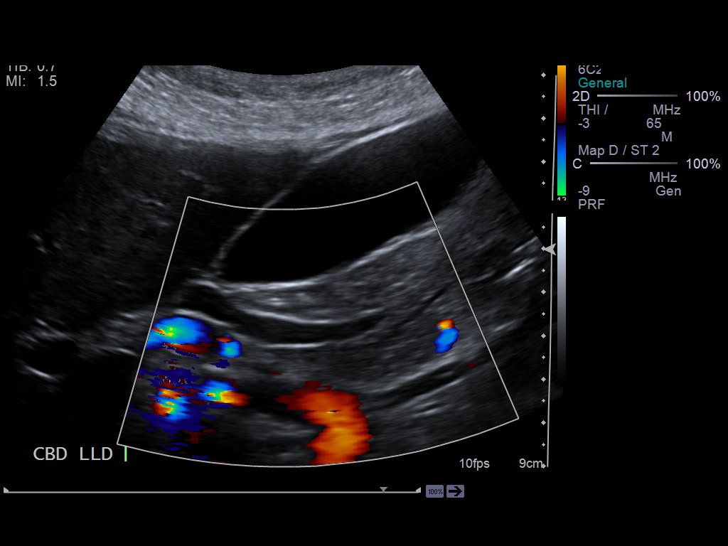

[13 of 25 positions shown; findings below may reference images not displayed]

FINDINGS: Within the gallbladder, there are echogenic foci which
move and shadow consistent with gallstones.  There is no
gallbladder wall thickening or pericholecystic fluid collection.
There is no intrahepatic biliary duct dilatation.  Common hepatic
and common bile ducts are mildly prominent measuring 8 mm.  No mass
or calculus is seen in the biliary ductal system.  The common bile
duct does taper somewhat distally.

In the liver, there is again noted an echogenic mass in the
posterior segment right lobe measuring 1.9 x 1.8 x 1.7 cm.  There
is a second hypoechoic mass near the dome measuring 1.9 x 1.8 by
2.0 cm.  There is a third mass which is hyperechoic in the anterior
segment right lobe of the liver near the dome measuring 2.7 x 2.0 x
1.9 cm.  There is a cystic area in the right lobe measuring 1.4 x
1.4 x 1.4 cm.  There is relatively disruption of the hepatic
architecture.
CONCLUSION: There are now multiple hyperechoic lesions seen in the
liver.  There is evidence of a change compared to previous study.
This finding warrants further evaluation.  Pre intravenous contrast
as well as arterial phase, venous phase, and dilated phased
intravenous contrast CT or MR to further assess the liver would be
strongly advised in this circumstance.

There is cholelithiasis.

## 2014-03-19 ENCOUNTER — Ambulatory Visit: Payer: BC Managed Care – PPO | Admitting: Family Medicine

## 2014-03-21 ENCOUNTER — Other Ambulatory Visit: Payer: Self-pay | Admitting: Obstetrics and Gynecology

## 2014-03-22 LAB — CYTOLOGY - PAP

## 2014-04-01 ENCOUNTER — Telehealth: Payer: Self-pay | Admitting: Family Medicine

## 2014-04-01 NOTE — Telephone Encounter (Signed)
Recently I had lab work forwarded to me from Dr. Matthew Saras. Blood work shows total cholesterol 262 HDL 68 LDL 166. Liver functions look good. The patient currently is using red rice yeast extract. Please send the patient a card letting her know that we do recommend yearly followup regarding her cholesterol. You can put on the card that we did get a copy of her most recent lab work.

## 2014-04-24 ENCOUNTER — Ambulatory Visit (INDEPENDENT_AMBULATORY_CARE_PROVIDER_SITE_OTHER): Payer: BC Managed Care – PPO | Admitting: Internal Medicine

## 2014-04-24 ENCOUNTER — Encounter (INDEPENDENT_AMBULATORY_CARE_PROVIDER_SITE_OTHER): Payer: Self-pay | Admitting: Internal Medicine

## 2014-04-24 VITALS — BP 136/68 | HR 64 | Temp 98.0°F | Ht 64.0 in | Wt 123.6 lb

## 2014-04-24 DIAGNOSIS — K5190 Ulcerative colitis, unspecified, without complications: Secondary | ICD-10-CM

## 2014-04-24 LAB — CBC WITH DIFFERENTIAL/PLATELET
BASOS PCT: 0 % (ref 0–1)
Basophils Absolute: 0 10*3/uL (ref 0.0–0.1)
EOS ABS: 0 10*3/uL (ref 0.0–0.7)
Eosinophils Relative: 0 % (ref 0–5)
HEMATOCRIT: 38.1 % (ref 36.0–46.0)
HEMOGLOBIN: 12.8 g/dL (ref 12.0–15.0)
LYMPHS ABS: 1.5 10*3/uL (ref 0.7–4.0)
Lymphocytes Relative: 22 % (ref 12–46)
MCH: 30 pg (ref 26.0–34.0)
MCHC: 33.6 g/dL (ref 30.0–36.0)
MCV: 89.2 fL (ref 78.0–100.0)
MONO ABS: 0.5 10*3/uL (ref 0.1–1.0)
MONOS PCT: 7 % (ref 3–12)
NEUTROS PCT: 71 % (ref 43–77)
Neutro Abs: 4.8 10*3/uL (ref 1.7–7.7)
Platelets: 279 10*3/uL (ref 150–400)
RBC: 4.27 MIL/uL (ref 3.87–5.11)
RDW: 13.6 % (ref 11.5–15.5)
WBC: 6.8 10*3/uL (ref 4.0–10.5)

## 2014-04-24 LAB — COMPREHENSIVE METABOLIC PANEL
ALT: 25 U/L (ref 0–35)
AST: 21 U/L (ref 0–37)
Albumin: 4.3 g/dL (ref 3.5–5.2)
Alkaline Phosphatase: 42 U/L (ref 39–117)
BILIRUBIN TOTAL: 0.5 mg/dL (ref 0.2–1.2)
BUN: 16 mg/dL (ref 6–23)
CALCIUM: 10 mg/dL (ref 8.4–10.5)
CHLORIDE: 104 meq/L (ref 96–112)
CO2: 27 meq/L (ref 19–32)
CREATININE: 0.73 mg/dL (ref 0.50–1.10)
GLUCOSE: 77 mg/dL (ref 70–99)
Potassium: 5.1 mEq/L (ref 3.5–5.3)
Sodium: 138 mEq/L (ref 135–145)
TOTAL PROTEIN: 7.4 g/dL (ref 6.0–8.3)

## 2014-04-24 LAB — C-REACTIVE PROTEIN

## 2014-04-24 NOTE — Progress Notes (Signed)
Subjective:    Patient ID: Yvonne [name], female    DOB: October 15, 1960, 53 y.o.   MRN: [id number]  HPI Here today for f/u of her chronic UC. She previously was taking 6-MP but was discontinued due to elevated transaminases. She was last seen in March of this year by Dr. Laural Golden.  Presently taking Lialda BID She tells me she is doing well. She is having a BM daily once in the am . No mucous or rectal bleeding Appetite is good. No weight loss. She is exercising about 4 times a week (walking). Diagnosed with UC in 2010 on colonoscopy by Dr. Gala Romney.   Bone Density Report this year 03/21/2014) AP Spine (L1-L4) T score -1.9, osteopenia Femoral Neck (Left) T score -1.9, osteopenia Femoral Neck (right) T score -2.2, ostenpenia Interpretation: Bone density scan Osteopenia. Overall, bone density is the same.                           CBC    Component Value Date/Time   WBC 9.7 10/17/2013 1034   RBC 4.20 10/17/2013 1034   HGB 12.6 10/17/2013 1034   HCT 38.7 10/17/2013 1034   PLT 413* 10/17/2013 1034   MCV 92.1 10/17/2013 1034   MCH 30.0 10/17/2013 1034   MCHC 32.6 10/17/2013 1034   RDW 13.2 10/17/2013 1034   LYMPHSABS 0.8 04/11/2013 1140   MONOABS 0.4 04/11/2013 1140   EOSABS 0.0 04/11/2013 1140   BASOSABS 0.0 04/11/2013 1140    10/2013 C-reactive protein less than 5.   Review of Systems Past Medical History  Diagnosis Date  . Ulcerative colitis   . HLD (hyperlipidemia)   . Osteoporosis   . Anemia     Past Surgical History  Procedure Laterality Date  . Partial hysterectomy      No Known Allergies  Current Outpatient Prescriptions on File Prior to Visit  Medication Sig Dispense Refill  . Biotin 10 MG TABS Take 1 tablet by mouth daily.      . Coenzyme Q10 (CO Q 10 PO) Take by mouth daily.      Javier Docker Oil 1000 MG CAPS Take 1 capsule (1,000 mg total) by mouth 2 (two) times daily.      . mesalamine (LIALDA) 1.2 G EC tablet Take 2 tablets (2.4 g total) by mouth 2 (two) times daily. Patient is taking  4 by mouth daily.  120 tablet  11  . Red Yeast Rice 600 MG CAPS Take 2 capsules by mouth.      . calcium carbonate (OS-CAL) 600 MG TABS Take 1,200 mg by mouth daily.       No current facility-administered medications on file prior to visit.        Objective:   Physical Exam  Filed Vitals:   04/24/14 0956  BP: 136/68  Pulse: 64  Temp: 98 F (36.7 C)  Height: 5' 4"  (1.626 m)  Weight: 123 lb 9.6 oz (56.065 kg)   Alert and oriented. Skin warm and dry. Oral mucosa is moist.   . Sclera anicteric, conjunctivae is pink. Thyroid not enlarged. No cervical lymphadenopathy. Lungs clear. Heart regular rate and rhythm.  Abdomen is soft. Bowel sounds are positive. No hepatomegaly. No abdominal masses felt. No tenderness.  No edema to lower extremities.          Assessment & Plan:   UC: she seems to be in remission. Doing well with Lialda BID. No GI symptoms. OV in 6  months with Dr. Laural Golden. CBC,CRP, Cmet today.

## 2014-04-24 NOTE — Patient Instructions (Signed)
Continue present medication. OV in 6 months

## 2014-05-09 ENCOUNTER — Ambulatory Visit (INDEPENDENT_AMBULATORY_CARE_PROVIDER_SITE_OTHER): Payer: BC Managed Care – PPO | Admitting: Family Medicine

## 2014-05-09 ENCOUNTER — Encounter: Payer: Self-pay | Admitting: Family Medicine

## 2014-05-09 VITALS — BP 122/78 | Ht 64.0 in | Wt 124.4 lb

## 2014-05-09 DIAGNOSIS — E785 Hyperlipidemia, unspecified: Secondary | ICD-10-CM | POA: Insufficient documentation

## 2014-05-09 DIAGNOSIS — M949 Disorder of cartilage, unspecified: Secondary | ICD-10-CM

## 2014-05-09 DIAGNOSIS — M899 Disorder of bone, unspecified: Secondary | ICD-10-CM

## 2014-05-09 DIAGNOSIS — Z23 Encounter for immunization: Secondary | ICD-10-CM

## 2014-05-09 DIAGNOSIS — M8580 Other specified disorders of bone density and structure, unspecified site: Secondary | ICD-10-CM | POA: Insufficient documentation

## 2014-05-09 NOTE — Progress Notes (Signed)
   Subjective:    Patient ID: Yvonne [name], female    DOB: 07/15/1961, 53 y.o.   MRN: [id number]  Hyperlipidemia This is a new problem. There are no known factors aggravating her hyperlipidemia. Pertinent negatives include no chest pain or shortness of breath. She is currently on no antihyperlipidemic treatment. There are no compliance problems.  There are no known risk factors for coronary artery disease.   Patient has recent bloodwork done through her gynecologist and her cholesterol came back high. She is here today to discuss this finding.   Patient states that she has no other concerns at this time.    Review of Systems  Constitutional: Negative for activity change, appetite change and fatigue.  Respiratory: Negative for cough and shortness of breath.   Cardiovascular: Negative for chest pain.  Endocrine: Negative for polydipsia and polyphagia.  Genitourinary: Negative for frequency.  Neurological: Negative for weakness.  Psychiatric/Behavioral: Negative for confusion.       Objective:   Physical Exam  Vitals reviewed. Constitutional: She appears well-nourished. No distress.  Cardiovascular: Normal rate, regular rhythm and normal heart sounds.   No murmur heard. Pulmonary/Chest: Effort normal and breath sounds normal. No respiratory distress.  Musculoskeletal: She exhibits no edema.  Lymphadenopathy:    She has no cervical adenopathy.  Neurological: She is alert. She exhibits normal muscle tone.  Psychiatric: Her behavior is normal.          Assessment & Plan:  Hyperlipidemia-according to the American Heart Association website as well as they are computer app she rates as a 1.9% risk of heart disease. Does not need to be on statins she does need to watch her diet closely and repeat this again in one years time  Has osteopenia followed by her gynecologist.

## 2014-08-16 ENCOUNTER — Encounter (INDEPENDENT_AMBULATORY_CARE_PROVIDER_SITE_OTHER): Payer: Self-pay

## 2014-08-27 ENCOUNTER — Encounter: Payer: Self-pay | Admitting: Family Medicine

## 2014-10-23 ENCOUNTER — Encounter (INDEPENDENT_AMBULATORY_CARE_PROVIDER_SITE_OTHER): Payer: Self-pay | Admitting: Internal Medicine

## 2014-10-23 ENCOUNTER — Ambulatory Visit (INDEPENDENT_AMBULATORY_CARE_PROVIDER_SITE_OTHER): Payer: 59 | Admitting: Internal Medicine

## 2014-10-23 VITALS — BP 110/68 | HR 78 | Temp 98.4°F | Resp 18 | Ht 64.0 in | Wt 129.4 lb

## 2014-10-23 DIAGNOSIS — K5190 Ulcerative colitis, unspecified, without complications: Secondary | ICD-10-CM

## 2014-10-23 MED ORDER — MESALAMINE 1.2 G PO TBEC: 2.4000 g | DELAYED_RELEASE_TABLET | Freq: Two times a day (BID) | ORAL | Status: DC

## 2014-10-23 NOTE — Patient Instructions (Signed)
Physician will call with results of urinalysis.

## 2014-10-23 NOTE — Progress Notes (Signed)
Presenting complaint;  Follow-up for ulcerative colitis.  Subjective:  Patient is 54 year old Caucasian female who has history of ulcerative colitis and is here for scheduled visit. She was last seen on 04/24/2014. She feels she is doing well. On most days she has 1 formed stool daily. She has not passed any blood per rectum over a year. She denies abdominal pain nocturnal diarrhea nausea vomiting or heartburn. She walks almost every day. She walks 12-14 miles per week. She has gained 6 pounds in the last 3 months. She says her LDL has been elevated but she is not on any medication because overall risk is low per Dr. Sallee Lange.   Current Medications: Outpatient Encounter Prescriptions as of 10/23/2014  Medication Sig  . calcium carbonate (OS-CAL) 600 MG TABS Take 1,200 mg by mouth daily.  . Coenzyme Q10 (CO Q 10 PO) Take by mouth daily.  Javier Docker Oil 1000 MG CAPS Take 1 capsule (1,000 mg total) by mouth 2 (two) times daily.  . mesalamine (LIALDA) 1.2 G EC tablet Take 2 tablets (2.4 g total) by mouth 2 (two) times daily. Patient is taking 4 by mouth daily.  Marland Kitchen OVER THE COUNTER MEDICATION Nature's Bounty Vitamin- Hair,Skin, Nails. Patient states that she takes 3 by mouth daily.  . [DISCONTINUED] Biotin 10 MG TABS Take 1 tablet by mouth daily.     Objective: Blood pressure 110/68, pulse 78, temperature 98.4 F (36.9 C), temperature source Oral, resp. rate 18, height 5' 4"  (1.626 m), weight 129 lb 6.4 oz (58.695 kg). Patient is alert and in no acute distress. Conjunctiva is pink. Sclera is nonicteric Oropharyngeal mucosa is normal. No neck masses or thyromegaly noted. Cardiac exam with regular rhythm normal S1 and S2. No murmur or gallop noted. Lungs are clear to auscultation. Abdomen is flat soft and nontender without organomegaly or masses. No LE edema or clubbing noted.  Labs/studies Results: Lab data from 04/24/2014  WBC 6.8, H&H 12.8 and 38.1 and platelet count 279K  CRP less  than 0.5 Serum creatinine 0.73 and BUN 16  Serum calcium 10  Bilirubin 0.5, AP 42, AST 21, ALT 25, total protein 7.4 and albumin 4.3.   Assessment:  #1. Ulcerative colitis. Disease duration 6 years. She remains in remission on oral mesalamine. 6-MP was discontinued on 05/26/2013 because of elevated transaminases. #2. History of elevated transaminases noted in fall of 2014. Transaminitis resolved on stopping 6-MP. LFTs remain normal.   Plan:  Continue Lialda at 2.4 g by mouth twice a day. Office visit in 1 year. Surveillance colonoscopy in 2020.

## 2014-10-24 LAB — URINALYSIS, ROUTINE W REFLEX MICROSCOPIC
Bilirubin Urine: NEGATIVE
Glucose, UA: NEGATIVE mg/dL
Hgb urine dipstick: NEGATIVE
KETONES UR: NEGATIVE mg/dL
Leukocytes, UA: NEGATIVE
NITRITE: NEGATIVE
PROTEIN: NEGATIVE mg/dL
Specific Gravity, Urine: 1.014 (ref 1.005–1.030)
UROBILINOGEN UA: 0.2 mg/dL (ref 0.0–1.0)
pH: 5.5 (ref 5.0–8.0)

## 2014-11-30 ENCOUNTER — Ambulatory Visit (INDEPENDENT_AMBULATORY_CARE_PROVIDER_SITE_OTHER): Payer: 59 | Admitting: Family Medicine

## 2014-11-30 ENCOUNTER — Encounter: Payer: Self-pay | Admitting: Family Medicine

## 2014-11-30 VITALS — BP 120/70 | Temp 98.4°F | Ht 64.0 in | Wt 130.0 lb

## 2014-11-30 DIAGNOSIS — J329 Chronic sinusitis, unspecified: Secondary | ICD-10-CM

## 2014-11-30 DIAGNOSIS — J310 Chronic rhinitis: Secondary | ICD-10-CM

## 2014-11-30 MED ORDER — AZITHROMYCIN 250 MG PO TABS: ORAL_TABLET | ORAL | Status: DC

## 2014-11-30 NOTE — Progress Notes (Signed)
   Subjective:    Patient ID: Yvonne [name], female    DOB: [birth year], 54 y.o.   MRN: [id number]  Sinusitis This is a new problem. The current episode started in the past 7 days. The problem is unchanged. There has been no fever. The pain is moderate. Associated symptoms include congestion, coughing, headaches, sinus pressure and a sore throat. Past treatments include oral decongestants. The treatment provided no relief.   Patient has no other concerns at this time.  Allergy and drainage and yellow disch   Bad cough,   Sore throat,   nasocort has not helped,   Allergies, no fever,  incr pressutre and dim energy l stable    Review of Systems  HENT: Positive for congestion, sinus pressure and sore throat.   Respiratory: Positive for cough.   Neurological: Positive for headaches.       Objective:   Physical Exam  Alert moderate malaise vital stable. H&T moderate his congestion frontal tenderness pharynx normal lungs bronchial cough no wheezes heart regular in rhythm.      Assessment & Plan:  Impression post viral or post flu rhinosinusitis/bronchitis plan antibiotics prescribed. Symptom care discussed. Warning signs discussed WSL

## 2015-02-13 ENCOUNTER — Telehealth (INDEPENDENT_AMBULATORY_CARE_PROVIDER_SITE_OTHER): Payer: Self-pay | Admitting: *Deleted

## 2015-02-13 NOTE — Telephone Encounter (Signed)
-  atient rec'd a letter from [employer] that they would no longer cover her Lialda. They will cover Apriso. Dr.Rehman approved change and also that samples may be given to her. 6 days were given, patient was called and made aware. She plans to pick up -

## 2015-02-20 ENCOUNTER — Encounter (INDEPENDENT_AMBULATORY_CARE_PROVIDER_SITE_OTHER): Payer: Self-pay | Admitting: Internal Medicine

## 2015-03-26 ENCOUNTER — Other Ambulatory Visit: Payer: Self-pay | Admitting: Obstetrics and Gynecology

## 2015-03-27 LAB — CYTOLOGY - PAP

## 2015-04-23 ENCOUNTER — Other Ambulatory Visit (INDEPENDENT_AMBULATORY_CARE_PROVIDER_SITE_OTHER): Payer: Self-pay | Admitting: Internal Medicine

## 2015-08-26 ENCOUNTER — Other Ambulatory Visit (INDEPENDENT_AMBULATORY_CARE_PROVIDER_SITE_OTHER): Payer: Self-pay | Admitting: Internal Medicine

## 2015-09-09 ENCOUNTER — Encounter (INDEPENDENT_AMBULATORY_CARE_PROVIDER_SITE_OTHER): Payer: Self-pay | Admitting: Internal Medicine

## 2015-09-09 ENCOUNTER — Other Ambulatory Visit (INDEPENDENT_AMBULATORY_CARE_PROVIDER_SITE_OTHER): Payer: Self-pay | Admitting: *Deleted

## 2015-09-09 DIAGNOSIS — K5190 Ulcerative colitis, unspecified, without complications: Secondary | ICD-10-CM

## 2015-09-09 NOTE — Telephone Encounter (Signed)
Will address with Dr.Rehman.

## 2015-11-05 ENCOUNTER — Encounter (INDEPENDENT_AMBULATORY_CARE_PROVIDER_SITE_OTHER): Payer: Self-pay | Admitting: Internal Medicine

## 2015-11-05 ENCOUNTER — Ambulatory Visit (INDEPENDENT_AMBULATORY_CARE_PROVIDER_SITE_OTHER): Payer: BLUE CROSS/BLUE SHIELD | Admitting: Internal Medicine

## 2015-11-05 VITALS — BP 110/70 | HR 68 | Temp 98.5°F | Resp 18 | Ht 64.0 in | Wt 131.5 lb

## 2015-11-05 DIAGNOSIS — K5190 Ulcerative colitis, unspecified, without complications: Secondary | ICD-10-CM

## 2015-11-05 DIAGNOSIS — L819 Disorder of pigmentation, unspecified: Secondary | ICD-10-CM

## 2015-11-05 LAB — COMPREHENSIVE METABOLIC PANEL
ALBUMIN: 4.2 g/dL (ref 3.6–5.1)
ALT: 13 U/L (ref 6–29)
AST: 19 U/L (ref 10–35)
Alkaline Phosphatase: 42 U/L (ref 33–130)
BILIRUBIN TOTAL: 0.4 mg/dL (ref 0.2–1.2)
BUN: 21 mg/dL (ref 7–25)
CALCIUM: 9.6 mg/dL (ref 8.6–10.4)
CHLORIDE: 103 mmol/L (ref 98–110)
CO2: 23 mmol/L (ref 20–31)
CREATININE: 0.67 mg/dL (ref 0.50–1.05)
Glucose, Bld: 83 mg/dL (ref 65–99)
Potassium: 4.6 mmol/L (ref 3.5–5.3)
Sodium: 134 mmol/L — ABNORMAL LOW (ref 135–146)
TOTAL PROTEIN: 7.4 g/dL (ref 6.1–8.1)

## 2015-11-05 NOTE — Patient Instructions (Signed)
You can go ahead and get shingles vaccine. Physician will call with results of blood tests.

## 2015-11-05 NOTE — Progress Notes (Signed)
Presenting complaint;  Follow-up for ulcerative colitis. Patient complains of pigmentation to skin involving both armpits.  Subjective:  Yvonne [name] is 55 year old Caucasian female who has chronic ulcerative colitis and is here for scheduled visit. She was last seen in March 2016. She she denies abdominal pain diarrhea melena or rectal bleeding. She generally has 1 stool per day. All of her stools are formed. She states she has not seen any blood in at least 2 years. She has very good appetite. She was recently begun on topical fluorouracil for precancerous facial skin lesion. Her only complaint is one of 5 pigmentation the skin involving both axilla. She states she has tried multiple over-the-counter remedies but nothing has worked. This pigmentation started about 4 months ago. She states it's not getting any worse. She wonders if Apriso could be the culprit. Her dermatologist raised the possibility of adrenal insufficiency. However she does not have fatigue weakness.   Current Medications: Outpatient Encounter Prescriptions as of 11/05/2015  Medication Sig  . APRISO 0.375 g 24 hr capsule TAKE 4 CAPSULES BY MOUTH IN THE MORNING WITH OR WITHOUT FOOD.  Marland Kitchen calcium carbonate (OS-CAL) 600 MG TABS Take 1,200 mg by mouth daily.  . Coenzyme Q10 (CO Q 10 PO) Take by mouth daily.  . fluorouracil (EFUDEX) 5 % cream Apply topically at bedtime.   Javier Docker Oil 1000 MG CAPS Take 1 capsule (1,000 mg total) by mouth 2 (two) times daily.  Marland Kitchen OVER THE COUNTER MEDICATION Nature's Bounty Vitamin- Hair,Skin, Nails. Patient states that she takes 2 by mouth daily.  . [DISCONTINUED] azithromycin (ZITHROMAX Z-PAK) 250 MG tablet Take 2 tablets (500 mg) on  Day 1,  followed by 1 tablet (250 mg) once daily on Days 2 through 5. (Patient not taking: Reported on 11/05/2015)  . [DISCONTINUED] desonide (DESOWEN) 0.05 % cream Apply 1 application topically 2 (two) times daily. Reported on 11/05/2015  . [DISCONTINUED] mesalamine (LIALDA)  1.2 G EC tablet Take 2 tablets (2.4 g total) by mouth 2 (two) times daily. Patient is taking 4 by mouth daily. (Patient not taking: Reported on 11/05/2015)   No facility-administered encounter medications on file as of 11/05/2015.     Objective: Blood pressure 110/70, pulse 68, temperature 98.5 F (36.9 C), temperature source Oral, resp. rate 18, height 5' 4"  (1.626 m), weight 131 lb 8 oz (59.648 kg). Patient is alert and in no acute distress. Conjunctiva is pink. Sclera is nonicteric Oropharyngeal mucosa is normal. She does not have pigmentation to buccal mucosa. No neck masses or thyromegaly noted. She does have pigmentation to skin involving both axillae. Cardiac exam with regular rhythm normal S1 and S2. No murmur or gallop noted. Lungs are clear to auscultation. Abdomen is soft and nontender without organomegaly or masses.  No LE edema or clubbing noted.   Assessment:  #1. Ulcerative colitis remains in remission. Disease duration 7 years. She was on 6-MP for few years and developed elevation of transaminases and 6-MP was discontinued with normalization of transaminases. She is having no side effects with oral mesalamine. #2. Pigmentation involving skin of both axillae. Doubt that it is due to Apriso. She also does not have any signs and symptoms of Addison's disease. Therefore etiology of hyper pigmentations unclear.   Plan:  CBC with differential and comprehensive chemistry panel. Patient informed that she can proceed with shingles vaccine since she is not on 6-MP. She will follow-up with Dr. Sallee Lange regarding hyperpigmentation involving axillary skin. Office visit in one year.

## 2015-11-08 ENCOUNTER — Telehealth (INDEPENDENT_AMBULATORY_CARE_PROVIDER_SITE_OTHER): Payer: Self-pay | Admitting: *Deleted

## 2015-11-08 DIAGNOSIS — K5190 Ulcerative colitis, unspecified, without complications: Secondary | ICD-10-CM

## 2015-11-08 LAB — CBC WITH DIFFERENTIAL/PLATELET
BASOS PCT: 1 % (ref 0–1)
Basophils Absolute: 0 10*3/uL (ref 0.0–0.1)
EOS ABS: 0 10*3/uL (ref 0.0–0.7)
EOS PCT: 0 % (ref 0–5)
HCT: 37.8 % (ref 36.0–46.0)
Hemoglobin: 12.5 g/dL (ref 12.0–15.0)
Lymphocytes Relative: 41 % (ref 12–46)
Lymphs Abs: 1.8 10*3/uL (ref 0.7–4.0)
MCH: 30.3 pg (ref 26.0–34.0)
MCHC: 33.1 g/dL (ref 30.0–36.0)
MCV: 91.5 fL (ref 78.0–100.0)
MONO ABS: 0.5 10*3/uL (ref 0.1–1.0)
MONOS PCT: 11 % (ref 3–12)
MPV: 11.8 fL (ref 8.6–12.4)
NEUTROS PCT: 47 % (ref 43–77)
Neutro Abs: 2.1 10*3/uL (ref 1.7–7.7)
PLATELETS: 264 10*3/uL (ref 150–400)
RBC: 4.13 MIL/uL (ref 3.87–5.11)
RDW: 13.3 % (ref 11.5–15.5)
WBC: 4.5 10*3/uL (ref 4.0–10.5)

## 2015-11-08 LAB — CORTISOL: CORTISOL PLASMA: 17.5 ug/dL

## 2015-11-08 NOTE — Telephone Encounter (Signed)
Per Dr.Rehman the patient will need to have labs drawn on 11/08/2015.

## 2015-12-03 DIAGNOSIS — L570 Actinic keratosis: Secondary | ICD-10-CM | POA: Diagnosis not present

## 2015-12-03 DIAGNOSIS — L810 Postinflammatory hyperpigmentation: Secondary | ICD-10-CM | POA: Diagnosis not present

## 2016-03-10 ENCOUNTER — Other Ambulatory Visit (INDEPENDENT_AMBULATORY_CARE_PROVIDER_SITE_OTHER): Payer: Self-pay | Admitting: Internal Medicine

## 2016-03-31 DIAGNOSIS — R319 Hematuria, unspecified: Secondary | ICD-10-CM | POA: Diagnosis not present

## 2016-03-31 DIAGNOSIS — Z1231 Encounter for screening mammogram for malignant neoplasm of breast: Secondary | ICD-10-CM | POA: Diagnosis not present

## 2016-03-31 DIAGNOSIS — Z0000 Encounter for general adult medical examination without abnormal findings: Secondary | ICD-10-CM | POA: Diagnosis not present

## 2016-03-31 DIAGNOSIS — Z13220 Encounter for screening for lipoid disorders: Secondary | ICD-10-CM | POA: Diagnosis not present

## 2016-03-31 DIAGNOSIS — Z13820 Encounter for screening for osteoporosis: Secondary | ICD-10-CM | POA: Diagnosis not present

## 2016-03-31 DIAGNOSIS — Z13228 Encounter for screening for other metabolic disorders: Secondary | ICD-10-CM | POA: Diagnosis not present

## 2016-03-31 DIAGNOSIS — Z6822 Body mass index (BMI) 22.0-22.9, adult: Secondary | ICD-10-CM | POA: Diagnosis not present

## 2016-03-31 DIAGNOSIS — Z01419 Encounter for gynecological examination (general) (routine) without abnormal findings: Secondary | ICD-10-CM | POA: Diagnosis not present

## 2016-03-31 DIAGNOSIS — Z1321 Encounter for screening for nutritional disorder: Secondary | ICD-10-CM | POA: Diagnosis not present

## 2016-07-06 ENCOUNTER — Telehealth: Payer: Self-pay | Admitting: Family Medicine

## 2016-07-06 NOTE — Telephone Encounter (Signed)
Review blood work results faxed over from [facility].

## 2016-07-06 NOTE — Telephone Encounter (Signed)
Lab work shows sodium potassium glucose kidney functions are good. Liver functions are good. Cholesterol shows LDL moderately elevated at 165 good cholesterol 65 vitamin D 41. Risk for heart diseases low at 1.5% for the next 10 years patient typically follows up here when necessary gets her wellness through [employer] health

## 2016-07-16 ENCOUNTER — Ambulatory Visit (INDEPENDENT_AMBULATORY_CARE_PROVIDER_SITE_OTHER): Payer: BLUE CROSS/BLUE SHIELD | Admitting: Family Medicine

## 2016-07-16 VITALS — BP 112/74 | Wt 128.0 lb

## 2016-07-16 DIAGNOSIS — Z23 Encounter for immunization: Secondary | ICD-10-CM

## 2016-07-16 DIAGNOSIS — E784 Other hyperlipidemia: Secondary | ICD-10-CM | POA: Diagnosis not present

## 2016-07-16 DIAGNOSIS — E7849 Other hyperlipidemia: Secondary | ICD-10-CM

## 2016-07-16 DIAGNOSIS — K5150 Left sided colitis without complications: Secondary | ICD-10-CM

## 2016-07-16 NOTE — Progress Notes (Signed)
   Subjective:    Patient ID: Yvonne [name], female    DOB: Feb 23, 1961, 55 y.o.   MRN: [id number]  HPIdiscuss lab results from gyn back in August.   Wants to discuss getting shingles vaccine.   Wants to get pneumonia and flu vaccine.  20 minutes was spent with the patient discussing lab results. Her LDL significantly elevated but she has large amount of good cholesterol therefore her risk for heart disease was calculated less than 2% risk over the next 10 years she denies any chest tightness pressure pain or shortness of breath she does relate that she does try to eat healthy does strength exercises. In addition to this her gynecologist did a bone density which showed some slight osteopenia but they will follow that every 2-3 years We did discuss vaccines because of her ulcerative colitis I do recommend the pneumonia vaccine. In addition to this shingles vaccine I would wait until age 63 rationale discussed   Review of Systems Please see above she denies chest tightness shortness of breath fever chills sweats    Objective:   Physical Exam Lungs clear no crackles heart is regular pulse normal BP is good extremities no edema skin warm dry       Assessment & Plan:  Hyperlipidemia-LDL significantly elevated but her HDL is excellent her risk for heart disease. Low I would not recommend statins at this point she will follow-up lipid profile again in the springtime  Osteopenia borderline-will be followed by her gynecologist  Pneumonia vaccine because of patient's history of ulcerative colitis  Hold off on shingles vaccine until age 56

## 2016-08-26 ENCOUNTER — Encounter (INDEPENDENT_AMBULATORY_CARE_PROVIDER_SITE_OTHER): Payer: Self-pay | Admitting: Internal Medicine

## 2016-12-02 DIAGNOSIS — D225 Melanocytic nevi of trunk: Secondary | ICD-10-CM | POA: Diagnosis not present

## 2016-12-02 DIAGNOSIS — L814 Other melanin hyperpigmentation: Secondary | ICD-10-CM | POA: Diagnosis not present

## 2016-12-02 DIAGNOSIS — L821 Other seborrheic keratosis: Secondary | ICD-10-CM | POA: Diagnosis not present

## 2016-12-02 DIAGNOSIS — D1801 Hemangioma of skin and subcutaneous tissue: Secondary | ICD-10-CM | POA: Diagnosis not present

## 2016-12-22 ENCOUNTER — Ambulatory Visit (INDEPENDENT_AMBULATORY_CARE_PROVIDER_SITE_OTHER): Payer: BLUE CROSS/BLUE SHIELD | Admitting: Internal Medicine

## 2016-12-22 ENCOUNTER — Encounter (INDEPENDENT_AMBULATORY_CARE_PROVIDER_SITE_OTHER): Payer: Self-pay | Admitting: Internal Medicine

## 2016-12-22 VITALS — BP 110/72 | HR 64 | Temp 97.8°F | Resp 18 | Ht 64.0 in | Wt 125.1 lb

## 2016-12-22 DIAGNOSIS — K5190 Ulcerative colitis, unspecified, without complications: Secondary | ICD-10-CM

## 2016-12-22 LAB — CBC
HEMATOCRIT: 39.3 % (ref 35.0–45.0)
HEMOGLOBIN: 12.6 g/dL (ref 11.7–15.5)
MCH: 29.7 pg (ref 27.0–33.0)
MCHC: 32.1 g/dL (ref 32.0–36.0)
MCV: 92.7 fL (ref 80.0–100.0)
MPV: 11.9 fL (ref 7.5–12.5)
Platelets: 248 10*3/uL (ref 140–400)
RBC: 4.24 MIL/uL (ref 3.80–5.10)
RDW: 13.4 % (ref 11.0–15.0)
WBC: 4.6 10*3/uL (ref 3.8–10.8)

## 2016-12-22 MED ORDER — MESALAMINE ER 0.375 G PO CP24: 1.5000 g | ORAL_CAPSULE | Freq: Every day | ORAL | 11 refills | Status: DC

## 2016-12-22 NOTE — Progress Notes (Signed)
Presenting complaint;  Follow-up for distal ulcerative colitis.  Subjective:  Yvonne [name] is 56 year old Caucasian female was history of distal UC diagnosed in 2010 was here for scheduled visit. She feels fine. Her bowels move at least 6 times a week. She passes formed stools. She denies abdominal pain cramping melena or rectal bleeding. She has lost 6 pounds. She stays active. [facility]. She states axillary  pigmentation has resolved. Family history is negative for CRC.   Current Medications: Outpatient Encounter Prescriptions as of 12/22/2016  Medication Sig  . APRISO 0.375 g 24 hr capsule TAKE 4 CAPSULES BY MOUTH DAILY.  . calcium carbonate (OS-CAL) 600 MG TABS Take 1,200 mg by mouth daily.  . Coenzyme Q10 (CO Q 10 PO) Take by mouth daily.  Javier Docker Oil 1000 MG CAPS Take 1 capsule (1,000 mg total) by mouth 2 (two) times daily.  Marland Kitchen OVER THE COUNTER MEDICATION Nature's Bounty Vitamin- Hair,Skin, Nails. Patient states that she takes 2 by mouth daily.   No facility-administered encounter medications on file as of 12/22/2016.      Objective: Blood pressure 110/72, pulse 64, temperature 97.8 F (36.6 C), temperature source Oral, resp. rate 18, height 5' 4"  (1.626 m), weight 125 lb 1.6 oz (56.7 kg). Patient is alert and in no acute distress. Conjunctiva is pink. Sclera is nonicteric Oropharyngeal mucosa is normal. No neck masses or thyromegaly noted. Cardiac exam with regular rhythm normal S1 and S2. No murmur or gallop noted. Lungs are clear to auscultation. Abdomen is flat soft and nontender without organomegaly or masses. No LE edema or clubbing noted.  Labs/studies Results: Last CBC was normal on 11/08/2015.   Assessment:  #1. Distal ulcerative colitis. She is maintained on oral mesalamine and appears to be in  remission. Her condition was diagnosed in July 2010 when she had colonoscopy.   Plan:  Continue Apriso at current dose of 1500 mg by mouth  daily. Patient will go to lab for CBC and CRP. Office visit in one year.

## 2016-12-22 NOTE — Patient Instructions (Signed)
Physician will call with results of blood tests 

## 2016-12-23 LAB — C-REACTIVE PROTEIN: CRP: 1 mg/L (ref <8.0)

## 2017-02-01 ENCOUNTER — Ambulatory Visit (INDEPENDENT_AMBULATORY_CARE_PROVIDER_SITE_OTHER): Payer: BLUE CROSS/BLUE SHIELD | Admitting: Family Medicine

## 2017-02-01 ENCOUNTER — Encounter: Payer: Self-pay | Admitting: Family Medicine

## 2017-02-01 VITALS — BP 110/62 | Temp 99.0°F | Ht 64.0 in | Wt 126.0 lb

## 2017-02-01 DIAGNOSIS — J019 Acute sinusitis, unspecified: Secondary | ICD-10-CM

## 2017-02-01 MED ORDER — AMOXICILLIN 500 MG PO TABS: 500.0000 mg | ORAL_TABLET | Freq: Three times a day (TID) | ORAL | 0 refills | Status: DC

## 2017-02-01 MED ORDER — MOMETASONE FUROATE 0.1 % EX CREA: TOPICAL_CREAM | CUTANEOUS | 1 refills | Status: DC

## 2017-02-01 NOTE — Progress Notes (Signed)
   Subjective:    Patient ID: Yvonne [name], female    DOB: January 17, 1961, 56 y.o.   MRN: [id number]  Sore Throat   The current episode started in the past 7 days. The problem has been gradually improving. Associated symptoms include congestion and coughing. Pertinent negatives include no ear pain or shortness of breath. She has tried acetaminophen for the symptoms. The treatment provided no relief.   Viral process over the past several days now with sinus pressure pain discomfort discolored drainage no wheezing or difficulty breathing   Review of Systems  Constitutional: Negative for activity change and fever.  HENT: Positive for congestion and rhinorrhea. Negative for ear pain.   Eyes: Negative for discharge.  Respiratory: Positive for cough. Negative for shortness of breath and wheezing.   Cardiovascular: Negative for chest pain.       Objective:   Physical Exam  Constitutional: She appears well-developed.  HENT:  Head: Normocephalic.  Nose: Nose normal.  Mouth/Throat: Oropharynx is clear and moist. No oropharyngeal exudate.  Neck: Neck supple.  Cardiovascular: Normal rate and normal heart sounds.   No murmur heard. Pulmonary/Chest: Effort normal and breath sounds normal. She has no wheezes.  Lymphadenopathy:    She has no cervical adenopathy.  Skin: Skin is warm and dry.  Nursing note and vitals reviewed.         Assessment & Plan:  Viral syndrome Secondary rhinosinusitis Antibiotics prescribed warning signs discussed follow-up if problems

## 2017-03-08 DIAGNOSIS — H40033 Anatomical narrow angle, bilateral: Secondary | ICD-10-CM | POA: Diagnosis not present

## 2017-03-08 DIAGNOSIS — G44219 Episodic tension-type headache, not intractable: Secondary | ICD-10-CM | POA: Diagnosis not present

## 2017-03-09 ENCOUNTER — Other Ambulatory Visit (INDEPENDENT_AMBULATORY_CARE_PROVIDER_SITE_OTHER): Payer: Self-pay | Admitting: Internal Medicine

## 2017-04-01 DIAGNOSIS — Z01419 Encounter for gynecological examination (general) (routine) without abnormal findings: Secondary | ICD-10-CM | POA: Diagnosis not present

## 2017-04-01 DIAGNOSIS — R87610 Atypical squamous cells of undetermined significance on cytologic smear of cervix (ASC-US): Secondary | ICD-10-CM | POA: Diagnosis not present

## 2017-04-01 DIAGNOSIS — Z1231 Encounter for screening mammogram for malignant neoplasm of breast: Secondary | ICD-10-CM | POA: Diagnosis not present

## 2017-04-01 DIAGNOSIS — Z6822 Body mass index (BMI) 22.0-22.9, adult: Secondary | ICD-10-CM | POA: Diagnosis not present

## 2017-06-30 ENCOUNTER — Encounter (INDEPENDENT_AMBULATORY_CARE_PROVIDER_SITE_OTHER): Payer: Self-pay | Admitting: Internal Medicine

## 2017-07-02 DIAGNOSIS — Z23 Encounter for immunization: Secondary | ICD-10-CM | POA: Diagnosis not present

## 2017-09-07 ENCOUNTER — Other Ambulatory Visit (INDEPENDENT_AMBULATORY_CARE_PROVIDER_SITE_OTHER): Payer: Self-pay | Admitting: Internal Medicine

## 2017-09-20 DIAGNOSIS — M67431 Ganglion, right wrist: Secondary | ICD-10-CM | POA: Diagnosis not present

## 2017-09-20 DIAGNOSIS — M654 Radial styloid tenosynovitis [de Quervain]: Secondary | ICD-10-CM | POA: Diagnosis not present

## 2017-12-21 ENCOUNTER — Ambulatory Visit (INDEPENDENT_AMBULATORY_CARE_PROVIDER_SITE_OTHER): Payer: BLUE CROSS/BLUE SHIELD | Admitting: Internal Medicine

## 2017-12-21 ENCOUNTER — Encounter (INDEPENDENT_AMBULATORY_CARE_PROVIDER_SITE_OTHER): Payer: Self-pay | Admitting: Internal Medicine

## 2017-12-21 VITALS — BP 120/74 | HR 75 | Temp 98.2°F | Resp 18 | Ht 64.0 in | Wt 129.6 lb

## 2017-12-21 DIAGNOSIS — K5190 Ulcerative colitis, unspecified, without complications: Secondary | ICD-10-CM

## 2017-12-21 DIAGNOSIS — K5900 Constipation, unspecified: Secondary | ICD-10-CM

## 2017-12-21 NOTE — Progress Notes (Signed)
Presenting complaint;  Follow-up for ulcerative colitis.  Database and subjective:  Patient is a 57 year old Caucasian female who has a history of distal ulcerative colitis which was diagnosed in in July 2010.  She was on 6-MP with control of her disease but she developed elevated transaminases over 4 years ago.  Work-up suggested hepatocellular injury secondary to 6-MP which was discontinued and she was switched to oral mesalamine.  Her transaminases returned to normal. She was last seen 1 year ago.  She has no complaints.  She generally has 1 bowel movement daily.  Every now and then she skipped 2 to 3 days.  She denies abdominal pain melena or rectal bleeding.  She has good appetite.  She has gained 5 pounds since her last visit.  She feels weight gain is because [facility] because she is busy with her grandson.  She has not had any blood work since her last visit.  Current Medications: Outpatient Encounter Medications as of 12/21/2017  Medication Sig  . APRISO 0.375 g 24 hr capsule TAKE 4 CAPSULES BY MOUTH DAILY.  Marland Kitchen Biotin 1 MG CAPS Take by mouth 2 (two) times daily after a meal.  . calcium carbonate (OS-CAL) 600 MG TABS Take 1,200 mg by mouth daily.  . cetirizine (ZYRTEC) 10 MG tablet Take 10 mg by mouth daily.  Javier Docker Oil 1000 MG CAPS Take 1 capsule (1,000 mg total) by mouth 2 (two) times daily.  Marland Kitchen OVER THE COUNTER MEDICATION Nature's Bounty Vitamin- Hair,Skin, Nails. Patient states that she takes 2 by mouth daily.  Marland Kitchen OVER THE COUNTER MEDICATION Move Free Ultra - Patient takes daily in the morning.  . [DISCONTINUED] amoxicillin (AMOXIL) 500 MG tablet Take 1 tablet (500 mg total) by mouth 3 (three) times daily. (Patient not taking: Reported on 12/21/2017)  . [DISCONTINUED] Coenzyme Q10 (CO Q 10 PO) Take by mouth daily.  . [DISCONTINUED] mometasone (ELOCON) 0.1 % cream Apply to affected area twice daily for allergic contact rash (Patient not taking: Reported on  12/21/2017)   No facility-administered encounter medications on file as of 12/21/2017.      Objective: Blood pressure 120/74, pulse 75, temperature 98.2 F (36.8 C), temperature source Oral, resp. rate 18, height 5' 4"  (1.626 m), weight 129 lb 9.6 oz (58.8 kg). Patient is alert and in no acute distress. Conjunctiva is pink. Sclera is nonicteric Oropharyngeal mucosa is normal. No neck masses or thyromegaly noted. Cardiac exam with regular rhythm normal S1 and S2. No murmur or gallop noted. Lungs are clear to auscultation. Abdomen is symmetrical soft and nontender without organomegaly or masses. No LE edema or clubbing noted.   Assessment:  #1.  Distal ulcerative colitis.  She appears to be in remission.  Disease was diagnosed in July 2010.  She will not be due for surveillance colonoscopy for another 5 years or so.   #2.  Constipation.   Plan:  Continue Apriso at current dose of 1500 mg p.o. every morning. High-fiber diet. If dietary measures do not suffice she can start Colace 200 mg p.o. Nightly. Patient will go to the lab for CBC, comprehensive chemistry panel and TSH.  Office visit in 1 year.   Screening colonoscopy following her visit next year.

## 2017-12-21 NOTE — Patient Instructions (Signed)
Physician will call with results of blood test when completed. Increase intake of fiber rich foods as discussed. Dietary changes do not help and try Colace 200 mg daily at bedtime(OTC stool softener).

## 2017-12-23 DIAGNOSIS — K5190 Ulcerative colitis, unspecified, without complications: Secondary | ICD-10-CM | POA: Diagnosis not present

## 2017-12-23 DIAGNOSIS — K5900 Constipation, unspecified: Secondary | ICD-10-CM | POA: Diagnosis not present

## 2017-12-23 LAB — COMPREHENSIVE METABOLIC PANEL
AG Ratio: 1.4 (calc) (ref 1.0–2.5)
ALBUMIN MSPROF: 4.2 g/dL (ref 3.6–5.1)
ALT: 13 U/L (ref 6–29)
AST: 17 U/L (ref 10–35)
Alkaline phosphatase (APISO): 44 U/L (ref 33–130)
BILIRUBIN TOTAL: 0.5 mg/dL (ref 0.2–1.2)
BUN: 18 mg/dL (ref 7–25)
CO2: 29 mmol/L (ref 20–32)
CREATININE: 0.67 mg/dL (ref 0.50–1.05)
Calcium: 9.7 mg/dL (ref 8.6–10.4)
Chloride: 105 mmol/L (ref 98–110)
Globulin: 3.1 g/dL (calc) (ref 1.9–3.7)
Glucose, Bld: 108 mg/dL (ref 65–139)
POTASSIUM: 4.7 mmol/L (ref 3.5–5.3)
SODIUM: 138 mmol/L (ref 135–146)
TOTAL PROTEIN: 7.3 g/dL (ref 6.1–8.1)

## 2017-12-23 LAB — TSH: TSH: 1.12 m[IU]/L

## 2017-12-23 LAB — CBC
HEMATOCRIT: 35.8 % (ref 35.0–45.0)
Hemoglobin: 11.8 g/dL (ref 11.7–15.5)
MCH: 29.9 pg (ref 27.0–33.0)
MCHC: 33 g/dL (ref 32.0–36.0)
MCV: 90.6 fL (ref 80.0–100.0)
MPV: 11.8 fL (ref 7.5–12.5)
PLATELETS: 235 10*3/uL (ref 140–400)
RBC: 3.95 10*6/uL (ref 3.80–5.10)
RDW: 12 % (ref 11.0–15.0)
WBC: 3.9 10*3/uL (ref 3.8–10.8)

## 2018-03-07 DIAGNOSIS — D1801 Hemangioma of skin and subcutaneous tissue: Secondary | ICD-10-CM | POA: Diagnosis not present

## 2018-03-07 DIAGNOSIS — L814 Other melanin hyperpigmentation: Secondary | ICD-10-CM | POA: Diagnosis not present

## 2018-03-07 DIAGNOSIS — D225 Melanocytic nevi of trunk: Secondary | ICD-10-CM | POA: Diagnosis not present

## 2018-03-07 DIAGNOSIS — L821 Other seborrheic keratosis: Secondary | ICD-10-CM | POA: Diagnosis not present

## 2018-04-11 ENCOUNTER — Encounter: Payer: Self-pay | Admitting: Family Medicine

## 2018-04-11 DIAGNOSIS — M8588 Other specified disorders of bone density and structure, other site: Secondary | ICD-10-CM | POA: Diagnosis not present

## 2018-04-11 DIAGNOSIS — Z6822 Body mass index (BMI) 22.0-22.9, adult: Secondary | ICD-10-CM | POA: Diagnosis not present

## 2018-04-11 DIAGNOSIS — Z01419 Encounter for gynecological examination (general) (routine) without abnormal findings: Secondary | ICD-10-CM | POA: Diagnosis not present

## 2018-04-11 DIAGNOSIS — Z1231 Encounter for screening mammogram for malignant neoplasm of breast: Secondary | ICD-10-CM | POA: Diagnosis not present

## 2018-04-11 DIAGNOSIS — E785 Hyperlipidemia, unspecified: Secondary | ICD-10-CM | POA: Diagnosis not present

## 2018-04-11 DIAGNOSIS — N958 Other specified menopausal and perimenopausal disorders: Secondary | ICD-10-CM | POA: Diagnosis not present

## 2018-04-11 DIAGNOSIS — Z13820 Encounter for screening for osteoporosis: Secondary | ICD-10-CM | POA: Diagnosis not present

## 2018-05-06 ENCOUNTER — Telehealth: Payer: Self-pay | Admitting: Family Medicine

## 2018-05-06 NOTE — Telephone Encounter (Signed)
This patient will be going to [place] in October.  I have advised the patient to get hepatitis A vaccine. Is there are any contraindications of her getting this vaccine with her ulcerative colitis and the medication she is on? (I am not aware of any) Thanks for your feedback

## 2018-05-18 ENCOUNTER — Encounter (INDEPENDENT_AMBULATORY_CARE_PROVIDER_SITE_OTHER): Payer: Self-pay | Admitting: *Deleted

## 2018-08-17 DIAGNOSIS — H40033 Anatomical narrow angle, bilateral: Secondary | ICD-10-CM | POA: Diagnosis not present

## 2018-08-17 DIAGNOSIS — G44209 Tension-type headache, unspecified, not intractable: Secondary | ICD-10-CM | POA: Diagnosis not present

## 2018-09-05 ENCOUNTER — Other Ambulatory Visit (INDEPENDENT_AMBULATORY_CARE_PROVIDER_SITE_OTHER): Payer: Self-pay | Admitting: Internal Medicine

## 2018-10-06 ENCOUNTER — Other Ambulatory Visit (INDEPENDENT_AMBULATORY_CARE_PROVIDER_SITE_OTHER): Payer: Self-pay | Admitting: Internal Medicine

## 2018-10-13 ENCOUNTER — Encounter (INDEPENDENT_AMBULATORY_CARE_PROVIDER_SITE_OTHER): Payer: Self-pay

## 2018-11-03 ENCOUNTER — Encounter (INDEPENDENT_AMBULATORY_CARE_PROVIDER_SITE_OTHER): Payer: Self-pay

## 2018-11-21 ENCOUNTER — Encounter (INDEPENDENT_AMBULATORY_CARE_PROVIDER_SITE_OTHER): Payer: Self-pay | Admitting: *Deleted

## 2018-12-27 ENCOUNTER — Ambulatory Visit (INDEPENDENT_AMBULATORY_CARE_PROVIDER_SITE_OTHER): Payer: BLUE CROSS/BLUE SHIELD | Admitting: Internal Medicine

## 2018-12-29 ENCOUNTER — Ambulatory Visit (INDEPENDENT_AMBULATORY_CARE_PROVIDER_SITE_OTHER): Payer: BLUE CROSS/BLUE SHIELD | Admitting: Internal Medicine

## 2018-12-29 ENCOUNTER — Encounter (INDEPENDENT_AMBULATORY_CARE_PROVIDER_SITE_OTHER): Payer: Self-pay | Admitting: Internal Medicine

## 2018-12-29 ENCOUNTER — Other Ambulatory Visit: Payer: Self-pay

## 2018-12-29 VITALS — BP 110/63 | HR 85 | Temp 98.0°F | Resp 18 | Ht 64.0 in | Wt 128.0 lb

## 2018-12-29 DIAGNOSIS — K5190 Ulcerative colitis, unspecified, without complications: Secondary | ICD-10-CM | POA: Diagnosis not present

## 2018-12-29 DIAGNOSIS — Z1159 Encounter for screening for other viral diseases: Secondary | ICD-10-CM | POA: Diagnosis not present

## 2018-12-29 MED ORDER — MESALAMINE ER 0.375 G PO CP24: 1.5000 g | ORAL_CAPSULE | Freq: Every day | ORAL | 11 refills | Status: DC

## 2018-12-29 NOTE — Progress Notes (Signed)
Presenting complaint;  Follow-up for ulcerative colitis.  Database and subjective:  Patient is a 58 year old Caucasian female who was ulcerative colitis and is here for scheduled visit.  She was last seen in May 2019. She was diagnosed with ulcerative colitis back in July 2010 when she presented with bloody diarrhea and underwent colonoscopy by Dr. Gala Romney revealing disease to splenic flexure. She developed elevated transaminases about 6 years ago while on 6-MP.  It was discontinued transaminases returned to normal.  She has been on oral mesalamine ever since has done well.  She generally has 1 formed stool daily.  Every now and then she may skip.  At the time of her visit 1 year ago she was having issues with constipation.  She was advised to take Metamucil and since she has been doing it her bowels been moving to her satisfaction.  She denies abdominal pain melena or rectal bleeding.  Her appetite is good and her weight has been stable.  She walks 2 miles every day.  She has not had any blood work since she was last seen.   Current Medications: Outpatient Encounter Medications as of 12/29/2018  Medication Sig  . APRISO 0.375 g 24 hr capsule TAKE 4 CAPSULES BY MOUTH DAILY.  . calcium carbonate (OS-CAL) 600 MG TABS Take 1,200 mg by mouth daily.  . cetirizine (ZYRTEC) 10 MG tablet Take 10 mg by mouth daily.  Javier Docker Oil 1000 MG CAPS Take 1 capsule (1,000 mg total) by mouth 2 (two) times daily.  . Methylcellulose, Laxative, (CITRUCEL) 500 MG TABS Take 500 mg by mouth 2 (two) times daily.  Marland Kitchen OVER THE COUNTER MEDICATION Nature's Bounty Vitamin- Hair,Skin, Nails. Patient states that she takes 2 by mouth daily.  . Calcium Citrate (CITRACAL PO) Take by mouth as needed.  . [DISCONTINUED] OVER THE COUNTER MEDICATION Move Free Ultra - Patient takes daily in the morning.   No facility-administered encounter medications on file as of 12/29/2018.      Objective: Blood pressure 110/63, pulse 85,  temperature 98 F (36.7 C), temperature source Oral, resp. rate 18, height 5' 4"  (1.626 m), weight 128 lb (58.1 kg). Patient is alert and in no acute distress. Conjunctiva is pink. Sclera is nonicteric Oropharyngeal mucosa is normal. No neck masses or thyromegaly noted. Cardiac exam with regular rhythm normal S1 and S2. No murmur or gallop noted. Lungs are clear to auscultation. Abdomen is soft and nontender with no organomegaly or masses. No LE edema or clubbing noted.  Labs/studies Results:   CBC Latest Ref Rng & Units 12/23/2017 12/22/2016 11/08/2015  WBC 3.8 - 10.8 Thousand/uL 3.9 4.6 4.5  Hemoglobin 11.7 - 15.5 g/dL 11.8 12.6 12.5  Hematocrit 35.0 - 45.0 % 35.8 39.3 37.8  Platelets 140 - 400 Thousand/uL 235 248 264    CMP Latest Ref Rng & Units 12/23/2017 11/05/2015 04/24/2014  Glucose 65 - 139 mg/dL 108 83 77  BUN 7 - 25 mg/dL 18 21 16   Creatinine 0.50 - 1.05 mg/dL 0.67 0.67 0.73  Sodium 135 - 146 mmol/L 138 134(L) 138  Potassium 3.5 - 5.3 mmol/L 4.7 4.6 5.1  Chloride 98 - 110 mmol/L 105 103 104  CO2 20 - 32 mmol/L 29 23 27   Calcium 8.6 - 10.4 mg/dL 9.7 9.6 10.0  Total Protein 6.1 - 8.1 g/dL 7.3 7.4 7.4  Total Bilirubin 0.2 - 1.2 mg/dL 0.5 0.4 0.5  Alkaline Phos 33 - 130 U/L - 42 42  AST 10 - 35 U/L 17 19  21  ALT 6 - 29 U/L 13 13 25     Hepatic Function Latest Ref Rng & Units 12/23/2017 11/05/2015 04/24/2014  Total Protein 6.1 - 8.1 g/dL 7.3 7.4 7.4  Albumin 3.6 - 5.1 g/dL - 4.2 4.3  AST 10 - 35 U/L 17 19 21   ALT 6 - 29 U/L 13 13 25   Alk Phosphatase 33 - 130 U/L - 42 42  Total Bilirubin 0.2 - 1.2 mg/dL 0.5 0.4 0.5  Bilirubin, Direct 0.0 - 0.3 mg/dL - - -    Lab Results  Component Value Date   CRP 1.0 12/22/2016      Assessment:  #1.  Chronic distal ulcerative colitis.  Disease duration about 10 years. She appears to be in remission.  She is not having any side effects with oral mesalamine.  Since she had left-sided distal disease she can wait another 4 to 5 years before her  surveillance colonoscopy.  Plan:  New prescription for Apriso given. She will have CBC CMET and TSH level. Office visit in 1 year.

## 2018-12-29 NOTE — Patient Instructions (Signed)
Physician will call with results of blood test when completed.

## 2018-12-30 ENCOUNTER — Other Ambulatory Visit (INDEPENDENT_AMBULATORY_CARE_PROVIDER_SITE_OTHER): Payer: Self-pay | Admitting: Internal Medicine

## 2018-12-30 LAB — CBC
HCT: 36.9 % (ref 35.0–45.0)
Hemoglobin: 12 g/dL (ref 11.7–15.5)
MCH: 29.9 pg (ref 27.0–33.0)
MCHC: 32.5 g/dL (ref 32.0–36.0)
MCV: 91.8 fL (ref 80.0–100.0)
MPV: 11.5 fL (ref 7.5–12.5)
Platelets: 272 10*3/uL (ref 140–400)
RBC: 4.02 10*6/uL (ref 3.80–5.10)
RDW: 12 % (ref 11.0–15.0)
WBC: 4.7 10*3/uL (ref 3.8–10.8)

## 2018-12-30 LAB — COMPREHENSIVE METABOLIC PANEL
AG Ratio: 1.3 (calc) (ref 1.0–2.5)
ALT: 14 U/L (ref 6–29)
AST: 17 U/L (ref 10–35)
Albumin: 4.6 g/dL (ref 3.6–5.1)
Alkaline phosphatase (APISO): 56 U/L (ref 37–153)
BUN: 14 mg/dL (ref 7–25)
CO2: 30 mmol/L (ref 20–32)
Calcium: 10.6 mg/dL — ABNORMAL HIGH (ref 8.6–10.4)
Chloride: 103 mmol/L (ref 98–110)
Creat: 0.69 mg/dL (ref 0.50–1.05)
Globulin: 3.6 g/dL (calc) (ref 1.9–3.7)
Glucose, Bld: 98 mg/dL (ref 65–99)
Potassium: 4.6 mmol/L (ref 3.5–5.3)
Sodium: 140 mmol/L (ref 135–146)
Total Bilirubin: 0.4 mg/dL (ref 0.2–1.2)
Total Protein: 8.2 g/dL — ABNORMAL HIGH (ref 6.1–8.1)

## 2018-12-30 LAB — TSH: TSH: 1.19 mIU/L (ref 0.40–4.50)

## 2019-01-02 ENCOUNTER — Other Ambulatory Visit (INDEPENDENT_AMBULATORY_CARE_PROVIDER_SITE_OTHER): Payer: Self-pay | Admitting: *Deleted

## 2019-01-04 ENCOUNTER — Other Ambulatory Visit (INDEPENDENT_AMBULATORY_CARE_PROVIDER_SITE_OTHER): Payer: Self-pay | Admitting: *Deleted

## 2019-01-04 ENCOUNTER — Encounter (INDEPENDENT_AMBULATORY_CARE_PROVIDER_SITE_OTHER): Payer: Self-pay | Admitting: *Deleted

## 2019-02-03 LAB — CALCIUM: Calcium: 10.1 mg/dL (ref 8.6–10.4)

## 2019-04-13 DIAGNOSIS — Z6821 Body mass index (BMI) 21.0-21.9, adult: Secondary | ICD-10-CM | POA: Diagnosis not present

## 2019-04-13 DIAGNOSIS — Z01419 Encounter for gynecological examination (general) (routine) without abnormal findings: Secondary | ICD-10-CM | POA: Diagnosis not present

## 2019-04-13 DIAGNOSIS — N760 Acute vaginitis: Secondary | ICD-10-CM | POA: Diagnosis not present

## 2019-04-13 DIAGNOSIS — Z1231 Encounter for screening mammogram for malignant neoplasm of breast: Secondary | ICD-10-CM | POA: Diagnosis not present

## 2019-05-25 DIAGNOSIS — Z23 Encounter for immunization: Secondary | ICD-10-CM | POA: Diagnosis not present

## 2019-07-04 ENCOUNTER — Encounter (INDEPENDENT_AMBULATORY_CARE_PROVIDER_SITE_OTHER): Payer: Self-pay

## 2019-07-04 ENCOUNTER — Encounter (INDEPENDENT_AMBULATORY_CARE_PROVIDER_SITE_OTHER): Payer: Self-pay | Admitting: Internal Medicine

## 2019-07-04 ENCOUNTER — Other Ambulatory Visit: Payer: Self-pay

## 2019-07-04 ENCOUNTER — Ambulatory Visit (INDEPENDENT_AMBULATORY_CARE_PROVIDER_SITE_OTHER): Payer: BLUE CROSS/BLUE SHIELD | Admitting: Internal Medicine

## 2019-07-04 VITALS — BP 110/72 | HR 89 | Temp 98.2°F | Ht 64.0 in | Wt 125.6 lb

## 2019-07-04 DIAGNOSIS — K5150 Left sided colitis without complications: Secondary | ICD-10-CM | POA: Diagnosis not present

## 2019-07-04 DIAGNOSIS — K644 Residual hemorrhoidal skin tags: Secondary | ICD-10-CM | POA: Diagnosis not present

## 2019-07-04 DIAGNOSIS — R197 Diarrhea, unspecified: Secondary | ICD-10-CM | POA: Insufficient documentation

## 2019-07-04 MED ORDER — LOPERAMIDE HCL 2 MG PO CAPS: 2.0000 mg | ORAL_CAPSULE | Freq: Two times a day (BID) | ORAL | Status: DC | PRN

## 2019-07-04 MED ORDER — NYSTATIN-TRIAMCINOLONE 100000-0.1 UNIT/GM-% EX CREA: 1.0000 "application " | TOPICAL_CREAM | Freq: Two times a day (BID) | CUTANEOUS | 0 refills | Status: DC

## 2019-07-04 MED ORDER — MESALAMINE 1.2 G PO TBEC: 2.4000 g | DELAYED_RELEASE_TABLET | Freq: Every day | ORAL | 5 refills | Status: DC

## 2019-07-04 NOTE — Patient Instructions (Signed)
Physician will call with results of blood test and stool studies when completed and further recommendations.

## 2019-07-04 NOTE — Progress Notes (Signed)
Presenting complaint;  Follow-up for ulcerative colitis. Patient having diarrhea and wonders if she is having a relapse.  Database and subjective:  Patient is 58 year old Caucasian female who has history of left-sided colitis dating back to July 2010 when she had colonoscopy.  Her disease is well controlled with 6-MP.  Back in 2014 she developed elevated transaminases and etiology was felt to be 6-MP which was discontinued.  Ever since she has been on oral mesalamine.  She did have a relapse in January 2015 and she was treated with prednisone. She was last seen in May 2020 and she was doing well on Apriso.  Patient states her prescription was changed from brand name to generic 6 months ago.  She says she had bad cold few weeks ago.  She noted change in her bowel movements about 2 weeks ago.  Her average is 1-2 formed stools daily.  She began to have diarrhea with as many as 5-8 stools per day.  Stool volume has been small.  She gives a score of 6 on [place] stool chart.  She has been passing mucus but no blood.  She has not experienced abdominal pain fever nausea vomiting or chills.  She has noted anal discomfort because she has been going to the bathroom so much.  She has been using Preparation H with some benefit.  She has been taking apple cider vinegar capsules.  She says she has taken 20 doses.  She wonders if it has anything to do with her symptoms.  She remains active.  She does Zumba 3 times a week.  She says she has been under a lot of stress because of elections on all the drama that has been going on.  Current Medications: Outpatient Encounter Medications as of 07/04/2019  Medication Sig  . cetirizine (ZYRTEC) 10 MG tablet Take 10 mg by mouth daily.  Javier Docker Oil 1000 MG CAPS Take 1 capsule (1,000 mg total) by mouth 2 (two) times daily.  . mesalamine (APRISO) 0.375 g 24 hr capsule Take 4 capsules (1.5 g total) by mouth daily.  Marland Kitchen OVER THE COUNTER MEDICATION Nature's Bounty Vitamin-  Hair,Skin, Nails. Patient states that she takes 2 by mouth daily.  . Methylcellulose, Laxative, (CITRUCEL) 500 MG TABS Take 500 mg by mouth 2 (two) times daily.   No facility-administered encounter medications on file as of 07/04/2019.      Objective: Blood pressure 110/72, pulse 89, temperature 98.2 F (36.8 C), temperature source Oral, height _0  (1.626 m), weight 125 lb 9.6 oz (57 kg). Patient is alert and in no acute distress. She is wearing a mask. Conjunctiva is pink. Sclera is nonicteric Oropharyngeal mucosa is normal. No neck masses or thyromegaly noted. Cardiac exam with regular rhythm normal S1 and S2. No murmur or gallop noted. Lungs are clear to auscultation. Abdomen is symmetrical.  Bowel sounds are normal.  On palpation abdomen is soft.  She has mild tenderness in right lower quadrant.  No organomegaly or masses. Rectal examination limited to external inspection.  She has a small firm tag posterior to the right of midline which appears to be small thrombosed hemorrhoid which appears to be resolving.  It is not tender. No LE edema or clubbing noted.  Labs/studies Results:  CBC Latest Ref Rng & Units 12/29/2018 12/23/2017 12/22/2016  WBC 3.8 - 10.8 Thousand/uL 4.7 3.9 4.6  Hemoglobin 11.7 - 15.5 g/dL 12.0 11.8 12.6  Hematocrit 35.0 - 45.0 % 36.9 35.8 39.3  Platelets 140 - 400 Thousand/uL  272 235 248    CMP Latest Ref Rng & Units 02/02/2019 12/29/2018 12/23/2017  Glucose 65 - 99 mg/dL - 98 108  BUN 7 - 25 mg/dL - 14 18  Creatinine 0.50 - 1.05 mg/dL - 0.69 0.67  Sodium 135 - 146 mmol/L - 140 138  Potassium 3.5 - 5.3 mmol/L - 4.6 4.7  Chloride 98 - 110 mmol/L - 103 105  CO2 20 - 32 mmol/L - 30 29  Calcium 8.6 - 10.4 mg/dL 10.1 10.6(H) 9.7  Total Protein 6.1 - 8.1 g/dL - 8.2(H) 7.3  Total Bilirubin 0.2 - 1.2 mg/dL - 0.4 0.5  Alkaline Phos 33 - 130 U/L - - -  AST 10 - 35 U/L - 17 17  ALT 6 - 29 U/L - 14 13    Hepatic Function Latest Ref Rng & Units 12/29/2018 12/23/2017  11/05/2015  Total Protein 6.1 - 8.1 g/dL 8.2(H) 7.3 7.4  Albumin 3.6 - 5.1 g/dL - - 4.2  AST 10 - 35 U/L _0 ALT 6 - 29 U/L _1 Alk Phosphatase 33 - 130 U/L - - 42  Total Bilirubin 0.2 - 1.2 mg/dL 0.4 0.5 0.4  Bilirubin, Direct 0.0 - 0.3 mg/dL - - -    Lab Results  Component Value Date   CRP 1.0 12/22/2016    Bone density results noted last study was on 04/11/2018 revealing osteopenia and more or less similar finding on study of August 2017.  Assessment:  #1.  Left-sided ulcerative colitis.  Disease duration 10 years.  He now has developed nonbloody diarrhea.  She possibly has mild flare but need to rule out infection before budesonide or prednisone considered.   Plan:  Patient will go to the lab for CBC with differential CRP and GI pathogen panel. Mycolog-II cream to be applied to perianal area twice daily for a week or so and thereafter on as-needed basis. Loperamide OTC 2 mg p.o. twice daily as needed. We will change her prescription to Lialda 2.4 g p.o. twice daily presuming it is cost effective if not she will continue Apriso. She is also due for colonoscopy which will be delayed until Covid-19 pandemic is over however colonoscopy may have to be performed earlier to restage her disease before biologic therapy considered. Office visit in 3 months.

## 2019-07-05 DIAGNOSIS — K5150 Left sided colitis without complications: Secondary | ICD-10-CM | POA: Diagnosis not present

## 2019-07-05 DIAGNOSIS — R197 Diarrhea, unspecified: Secondary | ICD-10-CM | POA: Diagnosis not present

## 2019-07-05 LAB — CBC WITH DIFFERENTIAL/PLATELET
Absolute Monocytes: 448 cells/uL (ref 200–950)
Basophils Absolute: 48 cells/uL (ref 0–200)
Basophils Relative: 0.6 %
Eosinophils Absolute: 160 cells/uL (ref 15–500)
Eosinophils Relative: 2 %
HCT: 38 % (ref 35.0–45.0)
Hemoglobin: 12.4 g/dL (ref 11.7–15.5)
Lymphs Abs: 1920 cells/uL (ref 850–3900)
MCH: 29.9 pg (ref 27.0–33.0)
MCHC: 32.6 g/dL (ref 32.0–36.0)
MCV: 91.6 fL (ref 80.0–100.0)
MPV: 11.3 fL (ref 7.5–12.5)
Monocytes Relative: 5.6 %
Neutro Abs: 5424 cells/uL (ref 1500–7800)
Neutrophils Relative %: 67.8 %
Platelets: 314 10*3/uL (ref 140–400)
RBC: 4.15 10*6/uL (ref 3.80–5.10)
RDW: 12.3 % (ref 11.0–15.0)
Total Lymphocyte: 24 %
WBC: 8 10*3/uL (ref 3.8–10.8)

## 2019-07-05 LAB — C-REACTIVE PROTEIN: CRP: 2.4 mg/L (ref <8.0)

## 2019-07-06 LAB — GASTROINTESTINAL PATHOGEN PANEL PCR
C. difficile Tox A/B, PCR: NOT DETECTED
Campylobacter, PCR: NOT DETECTED
Cryptosporidium, PCR: NOT DETECTED
E coli (ETEC) LT/ST PCR: NOT DETECTED
E coli (STEC) stx1/stx2, PCR: NOT DETECTED
E coli 0157, PCR: NOT DETECTED
Giardia lamblia, PCR: NOT DETECTED
Norovirus, PCR: NOT DETECTED
Rotavirus A, PCR: NOT DETECTED
Salmonella, PCR: NOT DETECTED
Shigella, PCR: NOT DETECTED

## 2019-07-07 ENCOUNTER — Other Ambulatory Visit (INDEPENDENT_AMBULATORY_CARE_PROVIDER_SITE_OTHER): Payer: Self-pay | Admitting: Internal Medicine

## 2019-07-07 ENCOUNTER — Encounter (INDEPENDENT_AMBULATORY_CARE_PROVIDER_SITE_OTHER): Payer: Self-pay

## 2019-07-07 MED ORDER — PREDNISONE 10 MG PO TABS: 30.0000 mg | ORAL_TABLET | Freq: Every day | ORAL | 0 refills | Status: DC

## 2019-07-21 ENCOUNTER — Encounter (INDEPENDENT_AMBULATORY_CARE_PROVIDER_SITE_OTHER): Payer: Self-pay

## 2019-07-21 ENCOUNTER — Telehealth (INDEPENDENT_AMBULATORY_CARE_PROVIDER_SITE_OTHER): Payer: Self-pay | Admitting: *Deleted

## 2019-07-21 NOTE — Telephone Encounter (Signed)
Hi Yvonne [name], Dr Laural Golden asked me to let him know how I'm doing since I started taking the prednisone. It's helping the inflammation, but I'm still going to the bathroom with loose bowels. At my last visit with , we talked about me having a colonoscopy. I would like to have one before the end of the year. Our insurance plan will be changing for 2021, with a much higher deductible. Please ask Dr. Laural Golden if he can schedule a colonoscopy for this month.  Thank you so much!   Dr.Rehman - Please advise , as you know we have no openings to add to the schedule.

## 2019-07-24 ENCOUNTER — Telehealth (INDEPENDENT_AMBULATORY_CARE_PROVIDER_SITE_OTHER): Payer: Self-pay | Admitting: *Deleted

## 2019-07-24 ENCOUNTER — Other Ambulatory Visit (INDEPENDENT_AMBULATORY_CARE_PROVIDER_SITE_OTHER): Payer: Self-pay | Admitting: *Deleted

## 2019-07-24 ENCOUNTER — Encounter (INDEPENDENT_AMBULATORY_CARE_PROVIDER_SITE_OTHER): Payer: Self-pay

## 2019-07-24 DIAGNOSIS — K5190 Ulcerative colitis, unspecified, without complications: Secondary | ICD-10-CM | POA: Insufficient documentation

## 2019-07-24 MED ORDER — SUPREP BOWEL PREP KIT 17.5-3.13-1.6 GM/177ML PO SOLN: 1.0000 | Freq: Once | ORAL | 0 refills | Status: AC

## 2019-07-24 NOTE — Telephone Encounter (Signed)
Okay to schedule colonoscopy this month.

## 2019-07-24 NOTE — Telephone Encounter (Signed)
TCS sch'd 08/03/19, patient aware, instructions sent through [facility]

## 2019-07-24 NOTE — Telephone Encounter (Signed)
Patient needs suprep TCS 12/17

## 2019-07-25 ENCOUNTER — Other Ambulatory Visit (INDEPENDENT_AMBULATORY_CARE_PROVIDER_SITE_OTHER): Payer: Self-pay | Admitting: *Deleted

## 2019-08-01 ENCOUNTER — Other Ambulatory Visit (HOSPITAL_COMMUNITY)
Admission: RE | Admit: 2019-08-01 | Discharge: 2019-08-01 | Disposition: A | Discharge status: Home / Self Care | Payer: BC Managed Care – PPO | Source: Ambulatory Visit | Attending: Internal Medicine | Admitting: Internal Medicine

## 2019-08-01 ENCOUNTER — Other Ambulatory Visit (HOSPITAL_COMMUNITY): Payer: BC Managed Care – PPO

## 2019-08-01 DIAGNOSIS — Z01812 Encounter for preprocedural laboratory examination: Secondary | ICD-10-CM | POA: Diagnosis not present

## 2019-08-01 DIAGNOSIS — Z20828 Contact with and (suspected) exposure to other viral communicable diseases: Secondary | ICD-10-CM | POA: Insufficient documentation

## 2019-08-02 ENCOUNTER — Encounter (INDEPENDENT_AMBULATORY_CARE_PROVIDER_SITE_OTHER): Payer: Self-pay

## 2019-08-02 LAB — SARS CORONAVIRUS 2 (TAT 6-24 HRS): SARS Coronavirus 2: NEGATIVE

## 2019-08-03 ENCOUNTER — Other Ambulatory Visit: Payer: Self-pay

## 2019-08-03 ENCOUNTER — Encounter (HOSPITAL_COMMUNITY)
Admission: RE | Disposition: A | Discharge status: Home / Self Care | Payer: Self-pay | Source: Home / Self Care | Attending: Internal Medicine

## 2019-08-03 ENCOUNTER — Encounter (HOSPITAL_COMMUNITY): Payer: Self-pay | Admitting: Internal Medicine

## 2019-08-03 ENCOUNTER — Ambulatory Visit (HOSPITAL_COMMUNITY)
Admission: RE | Admit: 2019-08-03 | Discharge: 2019-08-03 | Disposition: A | Discharge status: Home / Self Care | Payer: BC Managed Care – PPO | Attending: Internal Medicine | Admitting: Internal Medicine

## 2019-08-03 DIAGNOSIS — E785 Hyperlipidemia, unspecified: Secondary | ICD-10-CM | POA: Insufficient documentation

## 2019-08-03 DIAGNOSIS — K51919 Ulcerative colitis, unspecified with unspecified complications: Secondary | ICD-10-CM

## 2019-08-03 DIAGNOSIS — D649 Anemia, unspecified: Secondary | ICD-10-CM | POA: Insufficient documentation

## 2019-08-03 DIAGNOSIS — Z79899 Other long term (current) drug therapy: Secondary | ICD-10-CM | POA: Diagnosis not present

## 2019-08-03 DIAGNOSIS — M810 Age-related osteoporosis without current pathological fracture: Secondary | ICD-10-CM | POA: Diagnosis not present

## 2019-08-03 DIAGNOSIS — K5100 Ulcerative (chronic) pancolitis without complications: Secondary | ICD-10-CM | POA: Insufficient documentation

## 2019-08-03 DIAGNOSIS — K51019 Ulcerative (chronic) pancolitis with unspecified complications: Secondary | ICD-10-CM

## 2019-08-03 DIAGNOSIS — K5190 Ulcerative colitis, unspecified, without complications: Secondary | ICD-10-CM

## 2019-08-03 DIAGNOSIS — Z09 Encounter for follow-up examination after completed treatment for conditions other than malignant neoplasm: Secondary | ICD-10-CM

## 2019-08-03 HISTORY — PX: COLONOSCOPY: SHX5424

## 2019-08-03 HISTORY — PX: BIOPSY: SHX5522

## 2019-08-03 LAB — HEPATITIS B SURFACE ANTIGEN: Hepatitis B Surface Ag: NONREACTIVE

## 2019-08-03 LAB — HEPATITIS C ANTIBODY: HCV Ab: NONREACTIVE

## 2019-08-03 SURGERY — COLONOSCOPY
Anesthesia: Moderate Sedation

## 2019-08-03 MED ORDER — MIDAZOLAM HCL 5 MG/5ML IJ SOLN
INTRAMUSCULAR | Status: DC | PRN
  Administered 2019-08-03 (×4): 2 mg via INTRAVENOUS

## 2019-08-03 MED ORDER — SODIUM CHLORIDE 0.9 % IV SOLN
INTRAVENOUS | Status: DC
  Administered 2019-08-03: 1000 mL via INTRAVENOUS

## 2019-08-03 MED ORDER — MEPERIDINE HCL 50 MG/ML IJ SOLN
INTRAMUSCULAR | Status: DC | PRN
  Administered 2019-08-03 (×2): 25 mg via INTRAVENOUS

## 2019-08-03 MED ORDER — MIDAZOLAM HCL 5 MG/5ML IJ SOLN
INTRAMUSCULAR | Status: AC
  Filled 2019-08-03: qty 10

## 2019-08-03 MED ORDER — MEPERIDINE HCL 50 MG/ML IJ SOLN
INTRAMUSCULAR | Status: AC
  Filled 2019-08-03: qty 1

## 2019-08-03 NOTE — Discharge Instructions (Signed)
No aspirin or NSAIDs. Resume usual medications as before. Resume usual diet. No driving for 24 hours. Physician will call with results of blood test and biopsy and further recommendations.   Colonoscopy, Adult, Care After This sheet gives you information about how to care for yourself after your procedure. Your doctor may also give you more specific instructions. If you have problems or questions, call your doctor. What can I expect after the procedure? After the procedure, it is common to have:  A small amount of blood in your poop for 24 hours.  Some gas.  Mild cramping or bloating in your belly. Follow these instructions at home: General instructions  For the first 24 hours after the procedure: ? Do not drive or use machinery. ? Do not sign important documents. ? Do not drink alcohol. ? Do your daily activities more slowly than normal. ? Eat foods that are soft and easy to digest.  Take over-the-counter or prescription medicines only as told by your doctor. To help cramping and bloating:   Try walking around.  Put heat on your belly (abdomen) as told by your doctor. Use a heat source that your doctor recommends, such as a moist heat pack or a heating pad. ? Put a towel between your skin and the heat source. ? Leave the heat on for 20-30 minutes. ? Remove the heat if your skin turns bright red. This is especially important if you cannot feel pain, heat, or cold. You can get burned. Eating and drinking   Drink enough fluid to keep your pee (urine) clear or pale yellow.  Return to your normal diet as told by your doctor. Avoid heavy or fried foods that are hard to digest.  Avoid drinking alcohol for as long as told by your doctor. Contact a doctor if:  You have blood in your poop (stool) 2-3 days after the procedure. Get help right away if:  You have more than a small amount of blood in your poop.  You see large clumps of tissue (blood clots) in your poop.  Your  belly is swollen.  You feel sick to your stomach (nauseous).  You throw up (vomit).  You have a fever.  You have belly pain that gets worse, and medicine does not help your pain. Summary  After the procedure, it is common to have a small amount of blood in your poop. You may also have mild cramping and bloating in your belly.  For the first 24 hours after the procedure, do not drive or use machinery, do not sign important documents, and do not drink alcohol.  Get help right away if you have a lot of blood in your poop, feel sick to your stomach, have a fever, or have more belly pain. This information is not intended to replace advice given to you by your health care provider. Make sure you discuss any questions you have with your health care provider. Document Released: 09/05/2010 Document Revised: 06/03/2017 Document Reviewed: 04/27/2016 Elsevier Patient Education  2020 [place].  Ulcerative Colitis, Adult  Ulcerative colitis is long-lasting (chronic) inflammation of the large intestine (colon) and rectum. Sores (ulcers) may also form in these areas. Ulcerative colitis, along with a closely related condition called Crohn's disease, is often referred to as inflammatory bowel disease (IBD). What are the causes? This condition may be caused by increased activity of the immune system in the intestines. The immune system is the system that protects the body against harmful bacteria, viruses, fungi, and  other things that can make you sick. The cause of the increased activity of the immune system is not known. What increases the risk? The following factors may make you more likely to develop this condition:  Being 55-58 years old. The risk is also increased for people who are 49-87 years old.  Having a family history of ulcerative colitis.  Being of Jewish descent. What are the signs or symptoms? Symptoms vary depending on how severe the condition is. Common symptoms  include:  Rectal bleeding.  Diarrhea, often with blood or pus in the stool. Other symptoms can include:  Pain or cramping in the abdomen.  Fever.  Fatigue.  Weight loss.  Night sweats.  Rectal pain.  A strong and sudden need to have a bowel movement (bowel urgency).  Nausea.  Loss of appetite.  Anemia.  Yellowing of the skin (jaundice) from liver dysfunction.  Joint pain or soreness.  Eye irritation.  Skin rashes. Symptoms can range from mild to severe. They may come and go. How is this diagnosed? This condition may be diagnosed based on:  Your symptoms and medical history.  A physical exam.  Tests, including: ? Blood tests and stool tests. ? X-ray. ? A CT scan. ? An MRI. ? Colonoscopy. For this test, a flexible tube is inserted into your anus, and your colon is examined. ? Biopsy. In this test, a tissue sample is taken from your colon and examined under a microscope. How is this treated? Treatment for this condition may include medicines to:  Decrease swelling and inflammation.  Control your immune system.  Treat infections.  Relieve pain.  Control diarrhea. Severe flare-ups may need to be treated at a hospital. Treatment in a hospital may involve:  Resting the bowel. This involves not eating or drinking for a period of time.  Getting medicines through an IV.  Getting fluids and nutrition through: ? An IV. ? A tube that is passed through the nose and into the stomach (nasogastric tube, or NG tube).  Surgery to remove the affected part of the colon. This may be done if other treatments are not helping. This condition increases the risk of colon cancer. Adults with this condition will need to be watched for colon cancer throughout life. Follow these instructions at home: Medicines and vitamins  Take over-the-counter and prescription medicines only as told by your health care provider. Do not take aspirin.  If you were prescribed an  antibiotic medicine, take it as told by your health care provider. Do not stop taking the antibiotic even if you start to feel better.  Ask your health care provider if you should take any vitamins or supplements. You may need to take: ? Calcium and vitamin D for bone health. ? Iron to help treat anemia. Lifestyle  Exercise regularly.  Work with your health care provider to manage your condition and educate yourself about your condition.  Do not use any products that contain nicotine or tobacco, such as cigarettes, e-cigarettes, and chewing tobacco. If you need help quitting, ask your health care provider.  If you drink alcohol: ? Limit how much you use to:  0-1 drink a day for women.  0-2 drinks a day for men. ? Be aware of how much alcohol is in your drink. In the [place] one drink equals one 12 oz bottle of beer (355 mL), one 5 oz glass of wine (148 mL), or one 1 oz glass of hard liquor (44 mL). Eating and drinking  Drink  enough fluid to keep your urine pale yellow.  Ask your health care provider about the best diet for you. Follow the diet as told by your health care provider. This may include: ? Avoiding carbonated drinks. ? Avoiding popcorn, vegetable skins, nuts, and other high-fiber foods. ? Avoiding high-fat foods. ? Eating smaller meals more often. ? Limiting your intake of sugary drinks. ? Limiting your caffeine intake.  Follow food safety recommendations as told by your health care provider. This may include making sure you: ? Avoid eating raw or undercooked meat, fish, or eggs. ? Do not eat or drink spoiled or expired foods and drinks.  Keep a food diary. This may help you identify and avoid any foods that trigger your symptoms. General instructions  Wash your hands often with soap and water. If soap and water are not available, use hand sanitizer.  Stay up to date on your vaccinations, including a yearly (annual) flu shot. Ask your health care provider which  vaccines you should get.  Follow recommendations from your health care provider for having cancer screening tests. Ulcerative colitis may place you at increased risk for colon cancer.  Keep all follow-up visits as told by your health care provider. This is important. Contact a health care provider if:  Your symptoms do not improve or they get worse with treatment.  You continue to lose weight.  You have constant cramps or loose stools.  You develop a new skin rash, skin sores, or eye problems.  You have a fever or chills. Get help right away if:  You have bloody diarrhea.  You have severe bleeding from the rectum.  You feel that your heart is racing (tachycardia).  You have severe pain in your abdomen.  Your abdomen swells (abdominal distension).  Your abdomen is tender to the touch.  You vomit. Summary  Ulcerative colitis is long-lasting (chronic) inflammation of the large intestine (colon) and rectum. Sores (ulcers) may also form in these areas.  Follow instructions from your health care provider about medicines, lifestyle changes, and eating and drinking.  Contact your health care provider if symptoms do not improve or they get worse with treatment.  Get help right away if you have severe abdominal pain, abdominal swelling, or severe bleeding from the rectum.  Keep all follow-up visits as told by your health care provider. This is important. This information is not intended to replace advice given to you by your health care provider. Make sure you discuss any questions you have with your health care provider. Document Released: 05/13/2005 Document Revised: 05/23/2018 Document Reviewed: 05/25/2018 Elsevier Patient Education  2020 [employer].

## 2019-08-03 NOTE — Op Note (Signed)
[facility] Patient Name: Yvonne [name] Procedure Date: 08/03/2019 9:28 AM MRN: [id number] Date of Birth: 1961-04-15 Attending MD: Hildred Laser , MD CSN: [id number] Age: 58 Admit Type: Outpatient Procedure:                Colonoscopy Indications:              Left-sided chronic ulcerative colitis, Disease                            activity assessment of left-sided chronic                            ulcerative colitis Providers:                Hildred Laser, MD, Charlsie Quest. Theda Sers RN, RN, Nelma Rothman, Technician Referring MD:             Elayne Snare. Wolfgang Phoenix, MD Medicines:                Meperidine 50 mg IV, Midazolam 9 mg IV Complications:            No immediate complications. Estimated Blood Loss:     Estimated blood loss was minimal. Procedure:                Pre-Anesthesia Assessment:                           - Prior to the procedure, a History and Physical                            was performed, and patient medications and                            allergies were reviewed. The patient's tolerance of                            previous anesthesia was also reviewed. The risks                            and benefits of the procedure and the sedation                            options and risks were discussed with the patient.                            All questions were answered, and informed consent                            was obtained. Prior Anticoagulants: The patient has                            taken no previous anticoagulant or antiplatelet  agents. ASA Grade Assessment: II - A patient with                            mild systemic disease. After reviewing the risks                            and benefits, the patient was deemed in                            satisfactory condition to undergo the procedure.                           After obtaining informed consent, the colonoscope                            was  passed under direct vision. Throughout the                            procedure, the patient's blood pressure, pulse, and                            oxygen saturations were monitored continuously. The                            [id number]-[id number] ([id number]) scope was introduced through                            the anus and advanced to the the terminal ileum,                            with identification of the appendiceal orifice and                            IC valve. The colonoscopy was performed without                            difficulty. The patient tolerated the procedure                            well. The quality of the bowel preparation was                            excellent. The terminal ileum, ileocecal valve,                            appendiceal orifice, and rectum were photographed. Scope In: 9:50:46 AM Scope Out: 10:17:40 AM Scope Withdrawal Time: 0 hours 17 minutes 43 seconds  Total Procedure Duration: 0 hours 26 minutes 54 seconds  Findings:      The perianal and digital rectal examinations were normal.      Inflammation was found in a continuous and circumferential pattern from       the rectum to the hepatic flexure. This was graded as Mayo Score 2       (moderate, with marked  erythema, absent vascular pattern, friability,       erosions), and when compared to the previous examination, the findings       are worsened. Biopsies were taken with a cold forceps for histology. The       pathology specimen was placed into Bottle Number 1. The pathology       specimen was placed into Bottle Number 2. The pathology specimen was       placed into Bottle Number 3.      The ileum appeared normal. Impression:               - Moderately active (Mayo Score 2) pancolitis                            ulcerative colitis, worsened since the last                            examination. Biopsied.                           - The terminal ileum is normal.                           Comment:  UC has progressed from left sided disease                            to pancoloitis with sparing of cecum. There is                            erythema to ascending colon. Moderate Sedation:      Moderate (conscious) sedation was administered by the endoscopy nurse       and supervised by the endoscopist. The following parameters were       monitored: oxygen saturation, heart rate, blood pressure, CO2       capnography and response to care. Total physician intraservice time was       34 minutes. Recommendation:           - Patient has a contact number available for                            emergencies. The signs and symptoms of potential                            delayed complications were discussed with the                            patient. Return to normal activities tomorrow.                            Written discharge instructions were provided to the                            patient.                           - Resume previous diet today.                           -  Continue present medications.                           - No aspirin, ibuprofen, naproxen, or other                            non-steroidal anti-inflammatory drugs.                           - Await pathology results.                           - Check HbsAG, HCV antibody and Quitaferon TB gold                            plus.                           - Repeat colonoscopy is recommended. The                            colonoscopy date will be determined after pathology                            results from today's exam become available for                            review. Procedure Code(s):        --- Professional ---                           [phone], Colonoscopy, flexible; with biopsy, single                            or multiple                           99153, Moderate sedation; each additional 15                            minutes intraservice time                           G0500, Moderate sedation  services provided by the                            same physician or other qualified health care                            professional performing a gastrointestinal                            endoscopic service that sedation supports,                            requiring the presence of an independent trained  observer to assist in the monitoring of the                            patient's level of consciousness and physiological                            status; initial 15 minutes of intra-service time;                            patient age 8 years or older (additional time may                            be reported with [phone], as appropriate) Diagnosis Code(s):        --- Professional ---                           K51.00, Ulcerative (chronic) pancolitis without                            complications                           K51.50, Left sided colitis without complications CPT copyright 2019 American Medical Association. All rights reserved. The codes documented in this report are preliminary and upon coder review may  be revised to meet current compliance requirements. Hildred Laser, MD Hildred Laser, MD 08/03/2019 10:32:54 AM This report has been signed electronically. Number of Addenda: 0

## 2019-08-03 NOTE — H&P (Signed)
Yvonne [name] is an 58 y.o. female.   Chief Complaint: Patient is here for colonoscopy. HPI: Patient is 58 year old Caucasian female with 10-year history of distal ulcerative colitis who presented with rectal bleeding and some diarrhea.  Patient was seen in the office 1 month ago.  GI pathogen panel was negative.  She was begun on prednisone.  She is down to 20 mg daily.  She is still passing small amount of blood with her bowel movements and still having 6 or 7 stools per day most of her stools are small pieces and at times pasty.  She denies abdominal pain.  She has lost 5 pounds.  Patient's last relapse was about 6 years ago when she was treated with prednisone.  She had been on Lialda but was changed to [place] earlier this year for cost reasons. She is undergoing colonoscopy to restage disease activity prior to changing her treatment.  Past Medical History:  Diagnosis Date  . Anemia   . HLD (hyperlipidemia)   . Osteoporosis   . Ulcerative colitis     Past Surgical History:  Procedure Laterality Date  . PARTIAL HYSTERECTOMY      Family History  Problem Relation Age of Onset  . Colon cancer Neg Hx   . Diabetes Neg Hx   . Heart disease Neg Hx    Social History:  reports that she has never smoked. She has never used smokeless tobacco. She reports that she does not drink alcohol or use drugs.  Allergies: No Known Allergies  Medications Prior to Admission  Medication Sig Dispense Refill  . Biotin w/ Vitamins C & E (HAIR/SKIN/NAILS PO) Take 5,000 mcg by mouth 2 (two) times daily.    Marland Kitchen CALCIUM PO Take 200 mg by mouth daily.    Javier Docker Oil 1000 MG CAPS Take 1 capsule (1,000 mg total) by mouth 2 (two) times daily. (Patient taking differently: Take 1,000 mg by mouth 2 (two) times daily. )    . loperamide (IMODIUM) 2 MG capsule Take 1 capsule (2 mg total) by mouth 2 (two) times daily as needed for diarrhea or loose stools.    . mesalamine (APRISO) 0.375 g 24 hr capsule Take 375 mg by mouth  daily.    Marland Kitchen nystatin-triamcinolone (MYCOLOG II) cream Apply 1 application topically 2 (two) times daily. (Patient taking differently: Apply 1 application topically daily as needed (Hemrrhoids). ) 30 g 0  . predniSONE (DELTASONE) 10 MG tablet Take 3 tablets (30 mg total) by mouth daily with breakfast. Drop dose by 5 mg every week. (Patient taking differently: Take 30 mg by mouth daily with breakfast. Drop dose by 5 mg every wee Last dose of 30 mg 07/26/19 Start 20 mg 07/27/19) 100 tablet 0  . mesalamine (LIALDA) 1.2 g EC tablet Take 2 tablets (2.4 g total) by mouth daily with breakfast. (Patient taking differently: Take 0.375 g by mouth daily with breakfast. 4 tablets/ total .375 g) 60 tablet 5  . Methylcellulose, Laxative, (CITRUCEL) 500 MG TABS Take 500 mg by mouth 2 (two) times daily.      No results found for this or any previous visit (from the past 48 hour(s)). No results found.  Review of Systems  Blood pressure (!) 141/71, pulse 90, temperature 98.8 F (37.1 C), temperature source Oral, resp. rate 14, height 5' 4"  (1.626 m), weight 54.4 kg, SpO2 100 %. Physical Exam  Constitutional:  Well-developed thin female in NAD.  HENT:  Mouth/Throat: Oropharynx is clear and moist.  Eyes:  Conjunctivae are normal. No scleral icterus.  Neck: No thyromegaly present.  Cardiovascular: Normal rate, regular rhythm and normal heart sounds.  No murmur heard. Respiratory: Effort normal and breath sounds normal.  GI: Soft. She exhibits no distension and no mass. There is no abdominal tenderness.  Musculoskeletal:        General: No edema.  Lymphadenopathy:    She has no cervical adenopathy.  Neurological: She is alert.  Skin: Skin is warm and dry.     Assessment/Plan Chronic ulcerative colitis now with relapse and partial response to prednisone. Diagnostic colonoscopy.  Hildred Laser, MD 08/03/2019, 9:36 AM

## 2019-08-04 LAB — SURGICAL PATHOLOGY

## 2019-08-05 LAB — QUANTIFERON-TB GOLD PLUS: QuantiFERON-TB Gold Plus: NEGATIVE

## 2019-08-05 LAB — QUANTIFERON-TB GOLD PLUS (RQFGPL)
QuantiFERON Mitogen Value: 9.99 IU/mL
QuantiFERON Nil Value: 0.03 IU/mL
QuantiFERON TB1 Ag Value: 0.03 IU/mL
QuantiFERON TB2 Ag Value: 0.03 IU/mL

## 2019-08-14 ENCOUNTER — Encounter (INDEPENDENT_AMBULATORY_CARE_PROVIDER_SITE_OTHER): Payer: Self-pay

## 2019-09-20 ENCOUNTER — Encounter (INDEPENDENT_AMBULATORY_CARE_PROVIDER_SITE_OTHER): Payer: Self-pay

## 2019-10-10 ENCOUNTER — Encounter (INDEPENDENT_AMBULATORY_CARE_PROVIDER_SITE_OTHER): Payer: Self-pay

## 2019-12-04 ENCOUNTER — Telehealth (INDEPENDENT_AMBULATORY_CARE_PROVIDER_SITE_OTHER): Payer: Self-pay | Admitting: Internal Medicine

## 2019-12-04 ENCOUNTER — Encounter (INDEPENDENT_AMBULATORY_CARE_PROVIDER_SITE_OTHER): Payer: Self-pay | Admitting: *Deleted

## 2019-12-04 ENCOUNTER — Other Ambulatory Visit (INDEPENDENT_AMBULATORY_CARE_PROVIDER_SITE_OTHER): Payer: Self-pay | Admitting: Internal Medicine

## 2019-12-04 MED ORDER — PREDNISONE 10 MG PO TABS: 30.0000 mg | ORAL_TABLET | Freq: Every day | ORAL | 0 refills | Status: DC

## 2019-12-04 NOTE — Telephone Encounter (Signed)
Patient called states her UC is giving her problems and would like to be put on Prednisone - please advise - [phone]

## 2019-12-04 NOTE — Telephone Encounter (Signed)
Sedation begun on prednisone 30 mg daily for 1 week and thereafter drop the dose to 5 mg every week. Office visit in 1 month.

## 2019-12-05 NOTE — Telephone Encounter (Signed)
Dr.Rehman escribed Prednisone , patient was sent a My Chart Message letting her know.

## 2019-12-18 ENCOUNTER — Encounter (INDEPENDENT_AMBULATORY_CARE_PROVIDER_SITE_OTHER): Payer: Self-pay

## 2019-12-20 ENCOUNTER — Other Ambulatory Visit (INDEPENDENT_AMBULATORY_CARE_PROVIDER_SITE_OTHER): Payer: Self-pay | Admitting: Internal Medicine

## 2019-12-27 ENCOUNTER — Encounter (INDEPENDENT_AMBULATORY_CARE_PROVIDER_SITE_OTHER): Payer: Self-pay | Admitting: *Deleted

## 2020-01-02 ENCOUNTER — Ambulatory Visit (INDEPENDENT_AMBULATORY_CARE_PROVIDER_SITE_OTHER): Payer: 59 | Admitting: Internal Medicine

## 2020-01-02 ENCOUNTER — Other Ambulatory Visit: Payer: Self-pay

## 2020-01-02 ENCOUNTER — Ambulatory Visit (INDEPENDENT_AMBULATORY_CARE_PROVIDER_SITE_OTHER): Payer: BLUE CROSS/BLUE SHIELD | Admitting: Internal Medicine

## 2020-01-02 ENCOUNTER — Encounter (INDEPENDENT_AMBULATORY_CARE_PROVIDER_SITE_OTHER): Payer: Self-pay | Admitting: Internal Medicine

## 2020-01-02 VITALS — BP 149/90 | HR 87 | Temp 97.2°F | Ht 64.0 in | Wt 121.8 lb

## 2020-01-02 DIAGNOSIS — K51018 Ulcerative (chronic) pancolitis with other complication: Secondary | ICD-10-CM

## 2020-01-02 NOTE — Patient Instructions (Signed)
Continue prednisone 10 mg begun on Humira. If no side effects with Humira can drop prednisone dose to 5 mg daily for 1 week and stop

## 2020-01-02 NOTE — Progress Notes (Signed)
Presenting complaint;  Follow-up for ulcerative colitis.  Database and subjective:  Patient is 59 year old Caucasian female who has 11-year history of ulcerative colitis which was distal to begin with. She underwent colonoscopy in December 2020 and she was noted to have pan ulcerative colitis. Cecum was spared. There was no evidence of superadded CMV colitis. She responded to prednisone. As prednisone dose was decreased if symptoms relapse. So patient had to go back on prednisone. Request for Biologic/Humira has been approved by her insurance company but she has not received the medication yet.  Patient says she is doing much better. She will be dropping prednisone dose from 15 mg daily to 10 mg starting tomorrow morning. She is having 2-3 bowel movements per day. Stool still loose. She has noted decrease in the amount of rectal bleeding. She did see small amount over the weekend. She denies abdominal pain. She has good appetite. She denies fever chills nausea or vomiting. She states she had lost weight down 218 pounds. She has gained few pounds back. He says she has not used nystatin recently.  Patient has received both doses of Covid vaccine manufactured by [facility] and she did not have any serious side effects.  Current Medications: Outpatient Encounter Medications as of 01/02/2020  Medication Sig  . Biotin w/ Vitamins C & E (HAIR/SKIN/NAILS PO) Take 5,000 mcg by mouth 2 (two) times daily.  Marland Kitchen CALCIUM PO Take 200 mg by mouth daily.  Javier Docker Oil 1000 MG CAPS Take 1 capsule (1,000 mg total) by mouth 2 (two) times daily. (Patient taking differently: Take 1,000 mg by mouth 2 (two) times daily. )  . mesalamine (APRISO) 0.375 g 24 hr capsule TAKE 4 CAPSULES BY MOUTH EVERY DAY  . nystatin-triamcinolone (MYCOLOG II) cream Apply 1 application topically 2 (two) times daily. (Patient taking differently: Apply 1 application topically daily as needed (Hemrrhoids). )  . predniSONE (DELTASONE) 10 MG tablet Take  3 tablets (30 mg total) by mouth daily with breakfast. Drop dose by 5 mg every week.  . loperamide (IMODIUM) 2 MG capsule Take 1 capsule (2 mg total) by mouth 2 (two) times daily as needed for diarrhea or loose stools. (Patient not taking: Reported on 01/02/2020)  . Methylcellulose, Laxative, (CITRUCEL) 500 MG TABS Take 500 mg by mouth 2 (two) times daily.   No facility-administered encounter medications on file as of 01/02/2020.    Objective: Blood pressure (!) 149/90, pulse 87, temperature (!) 97.2 F (36.2 C), temperature source Temporal, height 5' 4"  (1.626 m), weight 121 lb 12.8 oz (55.2 kg). Patient is alert and in no acute distress. She is wearing a mask. Conjunctiva is pink. Sclera is nonicteric Oropharyngeal mucosa is normal. No neck masses or thyromegaly noted. Cardiac exam with regular rhythm normal S1 and S2. No murmur or gallop noted. Lungs are clear to auscultation. Abdomen is flat soft and nontender with organomegaly or masses. No LE edema or clubbing noted.  Labs/studies Results:  CBC Latest Ref Rng & Units 07/04/2019 12/29/2018 12/23/2017  WBC 3.8 - 10.8 Thousand/uL 8.0 4.7 3.9  Hemoglobin 11.7 - 15.5 g/dL 12.4 12.0 11.8  Hematocrit 35.0 - 45.0 % 38.0 36.9 35.8  Platelets 140 - 400 Thousand/uL 314 272 235    CMP Latest Ref Rng & Units 02/02/2019 12/29/2018 12/23/2017  Glucose 65 - 99 mg/dL - 98 108  BUN 7 - 25 mg/dL - 14 18  Creatinine 0.50 - 1.05 mg/dL - 0.69 0.67  Sodium 135 - 146 mmol/L - 140 138  Potassium 3.5 - 5.3 mmol/L - 4.6 4.7  Chloride 98 - 110 mmol/L - 103 105  CO2 20 - 32 mmol/L - 30 29  Calcium 8.6 - 10.4 mg/dL 10.1 10.6(H) 9.7  Total Protein 6.1 - 8.1 g/dL - 8.2(H) 7.3  Total Bilirubin 0.2 - 1.2 mg/dL - 0.4 0.5  Alkaline Phos 33 - 130 U/L - - -  AST 10 - 35 U/L - 17 17  ALT 6 - 29 U/L - 14 13    Hepatic Function Latest Ref Rng & Units 12/29/2018 12/23/2017 11/05/2015  Total Protein 6.1 - 8.1 g/dL 8.2(H) 7.3 7.4  Albumin 3.6 - 5.1 g/dL - - 4.2  AST 10 -  35 U/L 17 17 19   ALT 6 - 29 U/L 14 13 13   Alk Phosphatase 33 - 130 U/L - - 42  Total Bilirubin 0.2 - 1.2 mg/dL 0.4 0.5 0.4  Bilirubin, Direct 0.0 - 0.3 mg/dL - - -    Lab Results  Component Value Date   CRP 2.4 07/04/2019      Assessment:  #1. Pan ulcerative colitis. Disease duration 11 years. She had distal disease at the time of initial diagnosis but now it has extended to involve all of her colon except cecum. She has been on immunomodulator in the past but developed elevated transaminases. Her disease cannot be controlled with mesalamine alone. Therefore she would need to be on biologic as soon as prescription is filled. Please note that testing for TB and HBsAg and HCV were all negative.  Plan:  Patient will go to the lab for CBC, comprehensive chemistry panel and CRP. Patient will stay on prednisone 10 mg daily until she can start Humira/adalimumab. Once she is on biologic and able to tolerate it and if it works she will drop prednisone dose to 5 mg daily for 1 week and stop. Office visit in 3 months.

## 2020-01-03 LAB — CBC WITH DIFFERENTIAL/PLATELET
Absolute Monocytes: 452 cells/uL (ref 200–950)
Basophils Absolute: 52 cells/uL (ref 0–200)
Basophils Relative: 0.6 %
Eosinophils Absolute: 0 cells/uL — ABNORMAL LOW (ref 15–500)
Eosinophils Relative: 0 %
HCT: 37.1 % (ref 35.0–45.0)
Hemoglobin: 12.1 g/dL (ref 11.7–15.5)
Lymphs Abs: 1444 cells/uL (ref 850–3900)
MCH: 30.5 pg (ref 27.0–33.0)
MCHC: 32.6 g/dL (ref 32.0–36.0)
MCV: 93.5 fL (ref 80.0–100.0)
MPV: 11 fL (ref 7.5–12.5)
Monocytes Relative: 5.2 %
Neutro Abs: 6751 cells/uL (ref 1500–7800)
Neutrophils Relative %: 77.6 %
Platelets: 337 10*3/uL (ref 140–400)
RBC: 3.97 10*6/uL (ref 3.80–5.10)
RDW: 12.5 % (ref 11.0–15.0)
Total Lymphocyte: 16.6 %
WBC: 8.7 10*3/uL (ref 3.8–10.8)

## 2020-01-03 LAB — COMPREHENSIVE METABOLIC PANEL
AG Ratio: 1.5 (calc) (ref 1.0–2.5)
ALT: 16 U/L (ref 6–29)
AST: 13 U/L (ref 10–35)
Albumin: 4.4 g/dL (ref 3.6–5.1)
Alkaline phosphatase (APISO): 52 U/L (ref 37–153)
BUN: 17 mg/dL (ref 7–25)
CO2: 30 mmol/L (ref 20–32)
Calcium: 9.7 mg/dL (ref 8.6–10.4)
Chloride: 103 mmol/L (ref 98–110)
Creat: 0.78 mg/dL (ref 0.50–1.05)
Globulin: 3 g/dL (calc) (ref 1.9–3.7)
Glucose, Bld: 98 mg/dL (ref 65–139)
Potassium: 4.3 mmol/L (ref 3.5–5.3)
Sodium: 138 mmol/L (ref 135–146)
Total Bilirubin: 0.5 mg/dL (ref 0.2–1.2)
Total Protein: 7.4 g/dL (ref 6.1–8.1)

## 2020-01-03 LAB — C-REACTIVE PROTEIN: CRP: 0.9 mg/L (ref <8.0)

## 2020-01-05 ENCOUNTER — Encounter (INDEPENDENT_AMBULATORY_CARE_PROVIDER_SITE_OTHER): Payer: Self-pay

## 2020-01-18 ENCOUNTER — Encounter (INDEPENDENT_AMBULATORY_CARE_PROVIDER_SITE_OTHER): Payer: Self-pay

## 2020-01-25 ENCOUNTER — Encounter (INDEPENDENT_AMBULATORY_CARE_PROVIDER_SITE_OTHER): Payer: Self-pay

## 2020-01-29 ENCOUNTER — Ambulatory Visit (INDEPENDENT_AMBULATORY_CARE_PROVIDER_SITE_OTHER): Payer: 59

## 2020-01-31 ENCOUNTER — Other Ambulatory Visit: Payer: Self-pay

## 2020-01-31 ENCOUNTER — Ambulatory Visit (INDEPENDENT_AMBULATORY_CARE_PROVIDER_SITE_OTHER): Payer: 59 | Admitting: Gastroenterology

## 2020-01-31 DIAGNOSIS — K51018 Ulcerative (chronic) pancolitis with other complication: Secondary | ICD-10-CM

## 2020-01-31 NOTE — Progress Notes (Signed)
See [name] LPN Note -  Patient presented to the office this morning for teaching on the Humira Induction. Humira 80 mg/0.37m Abbvie  Exp. NOV 2022 , Lot [id number]  All questions were answered for the patient.  Day 1 - June 16 - Humira 80 mg/0.811mSQ Patient administered to right abdomen 2 inched from belly button. Day 2 - June 17 - patient will administer at home Humira 80 mL SQ. Day 15 - June 30  Patient will adminster at homr Humira 80 mg/0.8 mL SQ  Maintenance will start on July 14 - patient will adminster 40 mg/0.28m428mQ and will adminster every other week.   Patient did great with administering the Humira this morning. Patient currently has a follow up with Dr.Rehman on 04/23/2020.She will take her last Mesalamine this Friday,June 18 . She has been advised by Dr.Rehman not to refill this medication again.  Yvonne [name] presented to review , ask , answer questions to patient. Note to follow.    Patient reports she has completed her prednisone taper as of about 2 weeks ago.  She is now feeling fairly well and having 1-2 stools a day without any diarrhea, rectal bleeding, abdominal pain.  She is instructed to contact our office with any worsening symptoms prior to her follow-up appointment or any questions.

## 2020-01-31 NOTE — Progress Notes (Signed)
Patient presented to the office this morning for teaching on the Humira Induction. Humira 80 mg/0.359m Abbvie  Exp. NOV 2022 , Lot [id number]  All questions were answered for the patient.  Day 1 - June 16 - Humira 80 mg/0.858mSQ Patient administered to right abdomen 2 inched from belly button. Day 2 - June 17 - patient will administer at home Humira 80 mL SQ. Day 15 - June 30  Patient will adminster at homr Humira 80 mg/0.8 mL SQ  Maintenance will start on July 14 - patient will adminster 40 mg/0.59m65mQ and will adminster every other week.   Patient did great with administering the Humira this morning. Patient currently has a follow up with Dr.Rehman on 04/23/2020.She will take her last Mesalamine this Friday,June 18 . She has been advised by Dr.Rehman not to refill this medication again.  JanLaurine BlazerA presented to review , ask , answer questions to patient. Note to follow.

## 2020-02-21 ENCOUNTER — Telehealth: Payer: Self-pay | Admitting: Family Medicine

## 2020-02-21 DIAGNOSIS — Z13220 Encounter for screening for lipoid disorders: Secondary | ICD-10-CM

## 2020-02-21 DIAGNOSIS — K589 Irritable bowel syndrome without diarrhea: Secondary | ICD-10-CM

## 2020-02-21 NOTE — Telephone Encounter (Signed)
Lab orders placed and pt notified. Lab orders mailed to patient as a reminder.

## 2020-02-21 NOTE — Telephone Encounter (Signed)
Pt has appt on 8/9 and would like lab work done before appt.

## 2020-02-21 NOTE — Telephone Encounter (Signed)
No chronic meds from our office in [place] last seen 2018 for illness

## 2020-02-21 NOTE — Telephone Encounter (Signed)
CBC, CMP, lipid Inflammatory bowel disease, screening

## 2020-03-07 ENCOUNTER — Telehealth (INDEPENDENT_AMBULATORY_CARE_PROVIDER_SITE_OTHER): Payer: Self-pay | Admitting: *Deleted

## 2020-03-07 NOTE — Telephone Encounter (Signed)
Patient was called to get a progress report on how she was doing since starting the Humira.  She shares that she has been doing great. When she did the Humira Induction 80 mg - her stools were prefect. When she started the 40 mg - her stools are in pieces but she is going every day and is feeling good.  When ask about her Mesalamine - patient states that she had already stopped it,because her prescription ran out and Dr.Rehman had told her to just stop it after she had completed that prescription.  To review with Dr.Rehman.

## 2020-03-17 ENCOUNTER — Encounter (INDEPENDENT_AMBULATORY_CARE_PROVIDER_SITE_OTHER): Payer: Self-pay

## 2020-03-18 ENCOUNTER — Telehealth (INDEPENDENT_AMBULATORY_CARE_PROVIDER_SITE_OTHER): Payer: Self-pay | Admitting: *Deleted

## 2020-03-18 ENCOUNTER — Encounter (INDEPENDENT_AMBULATORY_CARE_PROVIDER_SITE_OTHER): Payer: Self-pay

## 2020-03-18 NOTE — Telephone Encounter (Signed)
Hi Yvonne [name], Friday I received a notice from [employer]  that they're denying coverage for the Humira again. I don't understand why.  Since you last called to check on  how  Im doing, my colitis has worsened. I've been having looser bowel movements with blood. They're not severe yet , but obviously the medicine isn't working as it should be. This started about last weekend. I took my Humira last Wednesday, the 28 th. I wanted to see if it would get better before I informed Dr Laural Golden, but it hasn't.   Please let him know. Thank you !     To be addressed with Dr.Rehman, once we have reviewed and he makes recommendation ,patient will be called.

## 2020-03-19 ENCOUNTER — Encounter (INDEPENDENT_AMBULATORY_CARE_PROVIDER_SITE_OTHER): Payer: Self-pay | Admitting: *Deleted

## 2020-03-19 LAB — COMPREHENSIVE METABOLIC PANEL
ALT: 10 IU/L (ref 0–32)
AST: 16 IU/L (ref 0–40)
Albumin/Globulin Ratio: 1.6 (ref 1.2–2.2)
Albumin: 4.6 g/dL (ref 3.8–4.9)
Alkaline Phosphatase: 67 IU/L (ref 48–121)
BUN/Creatinine Ratio: 19 (ref 9–23)
BUN: 14 mg/dL (ref 6–24)
Bilirubin Total: 0.3 mg/dL (ref 0.0–1.2)
CO2: 24 mmol/L (ref 20–29)
Calcium: 10.2 mg/dL (ref 8.7–10.2)
Chloride: 101 mmol/L (ref 96–106)
Creatinine, Ser: 0.74 mg/dL (ref 0.57–1.00)
GFR calc Af Amer: 104 mL/min/{1.73_m2} (ref >59)
GFR calc non Af Amer: 90 mL/min/{1.73_m2} (ref >59)
Globulin, Total: 2.9 g/dL (ref 1.5–4.5)
Glucose: 95 mg/dL (ref 65–99)
Potassium: 4.7 mmol/L (ref 3.5–5.2)
Sodium: 138 mmol/L (ref 134–144)
Total Protein: 7.5 g/dL (ref 6.0–8.5)

## 2020-03-19 LAB — CBC WITH DIFFERENTIAL/PLATELET
Basophils Absolute: 0.1 10*3/uL (ref 0.0–0.2)
Basos: 1 %
EOS (ABSOLUTE): 0 10*3/uL (ref 0.0–0.4)
Eos: 1 %
Hematocrit: 40.3 % (ref 34.0–46.6)
Hemoglobin: 13 g/dL (ref 11.1–15.9)
Immature Grans (Abs): 0 10*3/uL (ref 0.0–0.1)
Immature Granulocytes: 1 %
Lymphocytes Absolute: 2.1 10*3/uL (ref 0.7–3.1)
Lymphs: 34 %
MCH: 29.7 pg (ref 26.6–33.0)
MCHC: 32.3 g/dL (ref 31.5–35.7)
MCV: 92 fL (ref 79–97)
Monocytes Absolute: 0.6 10*3/uL (ref 0.1–0.9)
Monocytes: 9 %
Neutrophils Absolute: 3.3 10*3/uL (ref 1.4–7.0)
Neutrophils: 54 %
Platelets: 282 10*3/uL (ref 150–450)
RBC: 4.38 x10E6/uL (ref 3.77–5.28)
RDW: 12 % (ref 11.7–15.4)
WBC: 6 10*3/uL (ref 3.4–10.8)

## 2020-03-19 LAB — LIPID PANEL
Chol/HDL Ratio: 4 ratio (ref 0.0–4.4)
Cholesterol, Total: 300 mg/dL — ABNORMAL HIGH (ref 100–199)
HDL: 75 mg/dL (ref >39)
LDL Chol Calc (NIH): 210 mg/dL — ABNORMAL HIGH (ref 0–99)
Triglycerides: 91 mg/dL (ref 0–149)
VLDL Cholesterol Cal: 15 mg/dL (ref 5–40)

## 2020-03-19 NOTE — Telephone Encounter (Signed)
Per Dr.Rehman - patient is to go back on the Mesalamine (Apriso) taking 4 a day. She also will need to start the Prednisone 30 mg - she will take 30 mg for 1 week , 25 mg for 1 week , 20 mg for 1 week , 15 mg for 1 week , 10 mg for 1 week , 5 mg for 1 week then stop. Dr.Rehman also states that the patient has not been on the Humria long enough to determine that it is not working. She should try 3-4 more months.

## 2020-03-20 ENCOUNTER — Encounter (INDEPENDENT_AMBULATORY_CARE_PROVIDER_SITE_OTHER): Payer: Self-pay | Admitting: *Deleted

## 2020-03-25 ENCOUNTER — Encounter: Payer: Self-pay | Admitting: Family Medicine

## 2020-03-25 ENCOUNTER — Ambulatory Visit (INDEPENDENT_AMBULATORY_CARE_PROVIDER_SITE_OTHER): Payer: 59 | Admitting: Family Medicine

## 2020-03-25 ENCOUNTER — Other Ambulatory Visit: Payer: Self-pay

## 2020-03-25 VITALS — BP 118/72 | Temp 97.4°F | Ht 64.0 in | Wt 120.0 lb

## 2020-03-25 DIAGNOSIS — E7849 Other hyperlipidemia: Secondary | ICD-10-CM | POA: Diagnosis not present

## 2020-03-25 NOTE — Patient Instructions (Signed)
Results for orders placed or performed in visit on 02/21/20  CBC with Differential  Result Value Ref Range   WBC 6.0 3.4 - 10.8 x10E3/uL   RBC 4.38 3.77 - 5.28 x10E6/uL   Hemoglobin 13.0 11.1 - 15.9 g/dL   Hematocrit 40.3 34.0 - 46.6 %   MCV 92 79 - 97 fL   MCH 29.7 26.6 - 33.0 pg   MCHC 32.3 31 - 35 g/dL   RDW 12.0 11.7 - 15.4 %   Platelets 282 150 - 450 x10E3/uL   Neutrophils 54 Not Estab. %   Lymphs 34 Not Estab. %   Monocytes 9 Not Estab. %   Eos 1 Not Estab. %   Basos 1 Not Estab. %   Neutrophils Absolute 3.3 1 - 7 x10E3/uL   Lymphocytes Absolute 2.1 0 - 3 x10E3/uL   Monocytes Absolute 0.6 0 - 0 x10E3/uL   EOS (ABSOLUTE) 0.0 0.0 - 0.4 x10E3/uL   Basophils Absolute 0.1 0 - 0 x10E3/uL   Immature Granulocytes 1 Not Estab. %   Immature Grans (Abs) 0.0 0.0 - 0.1 x10E3/uL  Comprehensive Metabolic Panel (CMET)  Result Value Ref Range   Glucose 95 65 - 99 mg/dL   BUN 14 6 - 24 mg/dL   Creatinine, Ser 0.74 0.57 - 1.00 mg/dL   GFR calc non Af Amer 90 >59 mL/min/1.73   GFR calc Af Amer 104 >59 mL/min/1.73   BUN/Creatinine Ratio 19 9 - 23   Sodium 138 134 - 144 mmol/L   Potassium 4.7 3.5 - 5.2 mmol/L   Chloride 101 96 - 106 mmol/L   CO2 24 20 - 29 mmol/L   Calcium 10.2 8.7 - 10.2 mg/dL   Total Protein 7.5 6.0 - 8.5 g/dL   Albumin 4.6 3.8 - 4.9 g/dL   Globulin, Total 2.9 1.5 - 4.5 g/dL   Albumin/Globulin Ratio 1.6 1.2 - 2.2   Bilirubin Total 0.3 0.0 - 1.2 mg/dL   Alkaline Phosphatase 67 48 - 121 IU/L   AST 16 0 - 40 IU/L   ALT 10 0 - 32 IU/L  Lipid Profile  Result Value Ref Range   Cholesterol, Total 300 (H) 100 - 199 mg/dL   Triglycerides 91 0 - 149 mg/dL   HDL 75 >39 mg/dL   VLDL Cholesterol Cal 15 5 - 40 mg/dL   LDL Chol Calc (NIH) 210 (H) 0 - 99 mg/dL   Chol/HDL Ratio 4.0 0.0 - 4.4 ratio

## 2020-03-25 NOTE — Progress Notes (Signed)
° °  Subjective:    Patient ID: Yvonne [name], female    DOB: 1960-08-19, 59 y.o.   MRN: [id number]  HPI  Patient arrives for a follow up on recent labs. Patient also has form for her insurance- gets yearly physicals thru GYN. Patient works hard at [employer] regular physical activity Has underlying ulcerative colitis being treated by gastroenterology Is on Humira tolerating well currently Patient has family history hyperlipidemia Review of Systems  Constitutional: Negative for activity change and appetite change.  HENT: Negative for congestion and rhinorrhea.   Respiratory: Negative for cough and shortness of breath.   Cardiovascular: Negative for chest pain and leg swelling.  Gastrointestinal: Negative for abdominal pain, nausea and vomiting.  Skin: Negative for color change.  Neurological: Negative for dizziness and weakness.  Psychiatric/Behavioral: Negative for agitation and confusion.       Objective:   Physical Exam Vitals reviewed.  Constitutional:      General: She is not in acute distress. HENT:     Head: Normocephalic and atraumatic.  Eyes:     General:        Right eye: No discharge.        Left eye: No discharge.  Neck:     Trachea: No tracheal deviation.  Cardiovascular:     Rate and Rhythm: Normal rate and regular rhythm.     Heart sounds: Normal heart sounds. No murmur heard.   Pulmonary:     Effort: Pulmonary effort is normal. No respiratory distress.     Breath sounds: Normal breath sounds.  Lymphadenopathy:     Cervical: No cervical adenopathy.  Skin:    General: Skin is warm and dry.  Neurological:     Mental Status: She is alert.     Coordination: Coordination normal.  Psychiatric:        Behavior: Behavior normal.           Assessment & Plan:  1. Other hyperlipidemia LDL cholesterol 200 HDL 75 Based on calculations her risk for heart disease less than 3% Patient will work hard on continued healthy eating diet It is felt that  her LDL elevation is genetic Hold on statins currently Recheck in 1 years time  Should be noted that she sees her gynecologist on a regular basis for female health checkups including breast exam, pelvic exam, mammogram, bone density  Shingrix discussed patient will do later this year

## 2020-04-16 ENCOUNTER — Ambulatory Visit (INDEPENDENT_AMBULATORY_CARE_PROVIDER_SITE_OTHER): Payer: 59 | Admitting: Internal Medicine

## 2020-04-23 ENCOUNTER — Encounter (INDEPENDENT_AMBULATORY_CARE_PROVIDER_SITE_OTHER): Payer: Self-pay | Admitting: Internal Medicine

## 2020-04-23 ENCOUNTER — Other Ambulatory Visit: Payer: Self-pay

## 2020-04-23 ENCOUNTER — Ambulatory Visit (INDEPENDENT_AMBULATORY_CARE_PROVIDER_SITE_OTHER): Payer: 59 | Admitting: Internal Medicine

## 2020-04-23 VITALS — BP 120/64 | HR 74 | Temp 98.5°F | Ht 64.0 in | Wt 118.7 lb

## 2020-04-23 DIAGNOSIS — K5100 Ulcerative (chronic) pancolitis without complications: Secondary | ICD-10-CM

## 2020-04-23 NOTE — Progress Notes (Signed)
Presenting complaint;  Follow-up for ulcerative colitis.  Database and subjective:  Patient is a 59 year old Caucasian female who was over 11 history of ulcerative colitis who was last staged in December 2020 and noted to have pancolonic ulcerative colitis.  She previously had been on 6-MP but she developed hepatocellular injury and therefore therapy was discontinued.  Her disease could not be controlled with mesalamine alone.  She therefore was begun on Humira on 12 weeks ago.  First dose on 01/31/2020. Her last visit was on 01/02/2020.  Patient says she is doing well.  She is having 1 formed stool daily.  Occasionally she may have a mushy stool.  She denies abdominal pain urgency rectal bleeding or accidents.  She is not having any side effects with Merrem.  Her co-pay 0.  She will be dropping prednisone dose to 5 mg tomorrow and stop it in 8 days. She remains active.  She walks and does soma every day. She is scheduled for bone density study later this year.  Current Medications: Outpatient Encounter Medications as of 04/23/2020  Medication Sig  . Biotin w/ Vitamins C & E (HAIR/SKIN/NAILS PO) Take 5,000 mcg by mouth 2 (two) times daily.  Marland Kitchen CALCIUM PO Take 200 mg by mouth daily.  Javier Docker Oil 1000 MG CAPS Take 1 capsule (1,000 mg total) by mouth 2 (two) times daily. (Patient taking differently: Take 1,000 mg by mouth 2 (two) times daily. )  . mesalamine (APRISO) 0.375 g 24 hr capsule TAKE 4 CAPSULES BY MOUTH EVERY DAY  . predniSONE (DELTASONE) 10 MG tablet Take 3 tablets (30 mg total) by mouth daily with breakfast. Drop dose by 5 mg every week.  Marland Kitchen HUMIRA PEN 40 MG/0.4ML PNKT SMARTSIG:40 Milligram(s) SUB-Q Every 2 Weeks   No facility-administered encounter medications on file as of 04/23/2020.     Objective: Blood pressure 120/64, pulse 74, temperature 98.5 F (36.9 C), temperature source Oral, height _0  (1.626 m), weight 118 lb 11.2 oz (53.8 kg). Patient is alert and in no acute  distress. She is wearing a mask. Conjunctiva is pink. Sclera is nonicteric Oropharyngeal mucosa is normal. No neck masses or thyromegaly noted. Cardiac exam with regular rhythm normal S1 and S2. No murmur or gallop noted. Lungs are clear to auscultation. Abdomen is flat soft and nontender with organomegaly or masses. No LE edema or clubbing noted.  Labs/studies Results:  CBC Latest Ref Rng & Units 03/18/2020 01/02/2020 07/04/2019  WBC 3.4 - 10.8 x10E3/uL 6.0 8.7 8.0  Hemoglobin 11.1 - 15.9 g/dL 13.0 12.1 12.4  Hematocrit 34.0 - 46.6 % 40.3 37.1 38.0  Platelets 150 - 450 x10E3/uL 282 337 314    CMP Latest Ref Rng & Units 03/18/2020 01/02/2020 02/02/2019  Glucose 65 - 99 mg/dL 95 98 -  BUN 6 - 24 mg/dL 14 17 -  Creatinine 0.57 - 1.00 mg/dL 0.74 0.78 -  Sodium 134 - 144 mmol/L 138 138 -  Potassium 3.5 - 5.2 mmol/L 4.7 4.3 -  Chloride 96 - 106 mmol/L 101 103 -  CO2 20 - 29 mmol/L 24 30 -  Calcium 8.7 - 10.2 mg/dL 10.2 9.7 10.1  Total Protein 6.0 - 8.5 g/dL 7.5 7.4 -  Total Bilirubin 0.0 - 1.2 mg/dL 0.3 0.5 -  Alkaline Phos 48 - 121 IU/L 67 - -  AST 0 - 40 IU/L 16 13 -  ALT 0 - 32 IU/L 10 16 -    Hepatic Function Latest Ref Rng & Units 03/18/2020 01/02/2020  12/29/2018  Total Protein 6.0 - 8.5 g/dL 7.5 7.4 8.2(H)  Albumin 3.8 - 4.9 g/dL 4.6 - -  AST 0 - 40 IU/L _0 ALT 0 - 32 IU/L _1 Alk Phosphatase 48 - 121 IU/L 67 - -  Total Bilirubin 0.0 - 1.2 mg/dL 0.3 0.5 0.4  Bilirubin, Direct 0.0 - 0.3 mg/dL - - -    Lab Results  Component Value Date   CRP 0.9 01/02/2020      Assessment:  #1.  Pan ulcerative colitis.  Disease duration 11 years.  She started with left-sided disease but it extended to rest of the colon.  Patient has been on Humira for 12 weeks and it appears to be working and she is not having any side effects.  She will be coming off prednisone in 8 weeks.  For now she will continue oral mesalamine.   Plan:  Patient will finish prednisone per schedule.  Last  dose would be in 8 days. She will continue Merrem at current dose of 40 mg every 2 weeks and Apriso 1500 mg daily. Office visit in 6 months.

## 2020-04-23 NOTE — Patient Instructions (Signed)
Continue with prednisone taper as before which means last dose would be in 8 days from now. Please notify you experience diarrhea or rectal bleeding.

## 2020-05-09 ENCOUNTER — Other Ambulatory Visit (INDEPENDENT_AMBULATORY_CARE_PROVIDER_SITE_OTHER): Payer: Self-pay | Admitting: Internal Medicine

## 2020-05-27 ENCOUNTER — Encounter: Payer: Self-pay | Admitting: Family Medicine

## 2020-05-27 ENCOUNTER — Ambulatory Visit (INDEPENDENT_AMBULATORY_CARE_PROVIDER_SITE_OTHER): Payer: 59 | Admitting: Family Medicine

## 2020-05-27 VITALS — BP 122/76 | HR 82 | Temp 96.8°F | Wt 118.8 lb

## 2020-05-27 DIAGNOSIS — Z23 Encounter for immunization: Secondary | ICD-10-CM

## 2020-05-27 DIAGNOSIS — M810 Age-related osteoporosis without current pathological fracture: Secondary | ICD-10-CM

## 2020-05-27 NOTE — Progress Notes (Signed)
   Subjective:    Patient ID: Yvonne [name], female    DOB: Sep 02, 1960, 59 y.o.   MRN: [id number]  HPI Pt had bone density at [facility] on 04/25/20 and is showing pt has osteoporosis. Pt would like to discuss this with provider.  The patient brings in a bone density that shows osteoporosis in one spot and osteopenia in another spot.  The bone density is very basic and does not have extensive amount of details.  The patient is hopeful that she can just try harder with weightbearing exercise calcium and vitamin D she does have underlying inflammatory bowel disease and is on Humira  Review of Systems     Objective:   Physical Exam  This was a consultation discussion without physical exam 20 minutes spent with patient additional 5 minutes and documentation and review of chart      Assessment & Plan:  Osteoporosis Patient had bone density through her gynecologist but it really was not detailed enough This patient is 59 years old thin, has complicating factors of inflammatory bowel disease on Humira Oral biphosphonate's unlikely to work well for her because a history of reflux Patient at high risk of progressive severe issues as she gets older I would recommend consultation with endocrinology as well as consideration for Prolia We will turn this over to endocrinology to manage Rationale discussed with patient

## 2020-05-29 LAB — VITAMIN D 25 HYDROXY (VIT D DEFICIENCY, FRACTURES): Vit D, 25-Hydroxy: 31.7 ng/mL (ref 30.0–100.0)

## 2020-06-10 ENCOUNTER — Telehealth (INDEPENDENT_AMBULATORY_CARE_PROVIDER_SITE_OTHER): Payer: Self-pay | Admitting: *Deleted

## 2020-06-10 ENCOUNTER — Encounter (INDEPENDENT_AMBULATORY_CARE_PROVIDER_SITE_OTHER): Payer: Self-pay

## 2020-06-10 ENCOUNTER — Encounter (INDEPENDENT_AMBULATORY_CARE_PROVIDER_SITE_OTHER): Payer: Self-pay | Admitting: *Deleted

## 2020-06-10 NOTE — Telephone Encounter (Signed)
Hi Yvonne [name], I need to let Dr. Laural Golden know  that I'm having trouble with my UC.For a couple of weeks my bowel movements have been loose. Last Monday I started having some blood, and Friday the inflammation began.  I don't know what to do this time , since my bone density results showed osteoporosis. We know that prednisone is bad for my bones. I have an appointment November 10th with an Endocrinologist to discuss treatment.  Please ask Dr. Laural Golden if there's anything we can do.  Of course I'm taking the mesalamine and Humira as prescribed       Thank you so much !    This will be discussed with Dr.Rehman and the patient will be contacted with his recommendation.

## 2020-06-10 NOTE — Telephone Encounter (Signed)
I sent patient another message asking some additional questions. Below is her responses.   I'm going mostly in the mornings , about 3 times   I had my last Humira shot last Wednesday and the loose bowels started around 7 days before that. The bleeding started 2 days before the shot and inflammation came 2 days after my shot. I haven't had any fever , nausea or vomiting.    This will be addressed with Dr.Rehman 06/11/2020.

## 2020-06-11 ENCOUNTER — Encounter (INDEPENDENT_AMBULATORY_CARE_PROVIDER_SITE_OTHER): Payer: Self-pay | Admitting: *Deleted

## 2020-06-11 NOTE — Telephone Encounter (Signed)
Talked with Dr.Rehman- he ask that we call in the Budesonide. Patient will take 9 mg by mouth for 2 weeks , 6 mg by mouth for 2 weeks and 3 mg by mouth for 2 weeks. This was called to the patient's pharmacy and the patient was sent a message through [facility].

## 2020-07-03 ENCOUNTER — Other Ambulatory Visit (INDEPENDENT_AMBULATORY_CARE_PROVIDER_SITE_OTHER): Payer: Self-pay | Admitting: Internal Medicine

## 2020-07-03 NOTE — Telephone Encounter (Signed)
Last seen 04/23/2020 by Dr. Laural Golden ulcerative pancolitis without complication.

## 2020-07-22 ENCOUNTER — Telehealth (INDEPENDENT_AMBULATORY_CARE_PROVIDER_SITE_OTHER): Payer: Self-pay | Admitting: *Deleted

## 2020-07-22 ENCOUNTER — Other Ambulatory Visit (INDEPENDENT_AMBULATORY_CARE_PROVIDER_SITE_OTHER): Payer: Self-pay | Admitting: *Deleted

## 2020-07-22 ENCOUNTER — Other Ambulatory Visit (INDEPENDENT_AMBULATORY_CARE_PROVIDER_SITE_OTHER): Payer: Self-pay | Admitting: Gastroenterology

## 2020-07-22 DIAGNOSIS — K51019 Ulcerative (chronic) pancolitis with unspecified complications: Secondary | ICD-10-CM

## 2020-07-22 NOTE — Addendum Note (Signed)
Addended by: Harvel Quale on: 07/22/2020 03:34 PM   Modules accepted: Orders

## 2020-07-22 NOTE — Progress Notes (Signed)
Orders for patient, please send them  Thanks  Maylon Peppers, MD Gastroenterology and Hepatology [facility]

## 2020-07-22 NOTE — Progress Notes (Signed)
Noted  

## 2020-07-22 NOTE — Telephone Encounter (Signed)
Please call [phone] still having complications with [place] - finished rx from 10/26 - not completely better - wants to talk to tammy

## 2020-07-22 NOTE — Telephone Encounter (Signed)
Yvonne [name] states that she completed the Budesonide today. She feels that it may have helped a little. She started with 9 mg twice a day for 2 weeks, 6 mg twice a day for 2 weeks then 3 mg by mouth for 2 weeks. She is experiencing inflammation every morning , rectal pressure all the time. BM's vary may be up to 5 times a day. In the morning she sees blood , later in the morning she does not see any blood.  She is also complaining of fecal accidents, to include gas and blood. She is injecting the Humira 40 mg every 2 weeks , the last dose was 07-17-20. Also taking Apriso as directed.  Ms. Sessler did see a doctor about her Osteoporosis. She was given Fosamax ( generic)that she will take every Saturday.  She really noticed the gas after taking this and questions if the way she is feeling may be coming from this.  Explained to the patient that Dr.Rehman was on vacation and that I would ask Dr.Castaneda to review and advise.

## 2020-07-22 NOTE — Telephone Encounter (Signed)
It is difficult to tell from her symptoms if this is only related to actrive ulcerative colitis, but it looks like it is still active. I would recommend checking her Humira levels/antibodies (check test the day before her next injection) and checking a fecal calprotectin.  Maylon Peppers, MD Gastroenterology and Hepatology [facility]

## 2020-07-22 NOTE — Telephone Encounter (Signed)
Patient was called and made aware that she will need to have her lab work 1 day prior to her next injection. This will be next Tuesday.She will get the containers for stool studies at that time.

## 2020-08-01 LAB — C. DIFFICILE GDH AND TOXIN A/B
GDH ANTIGEN: NOT DETECTED
MICRO NUMBER:: 11322161
SPECIMEN QUALITY:: ADEQUATE
TOXIN A AND B: NOT DETECTED

## 2020-08-10 LAB — ADALIMUMAB DRUG LEVEL AND ANTIDRUG AB FOR IBD
ADALIMUMAB ADA,IBD: <10 AU (ref <10)
ADALIMUMAB LEVEL,IBD: 6.2 ug/mL

## 2020-08-10 LAB — CALPROTECTIN: Calprotectin: 548 mcg/g — ABNORMAL HIGH

## 2020-08-15 ENCOUNTER — Telehealth (INDEPENDENT_AMBULATORY_CARE_PROVIDER_SITE_OTHER): Payer: Self-pay | Admitting: *Deleted

## 2020-08-15 ENCOUNTER — Encounter (INDEPENDENT_AMBULATORY_CARE_PROVIDER_SITE_OTHER): Payer: Self-pay

## 2020-08-15 ENCOUNTER — Encounter (INDEPENDENT_AMBULATORY_CARE_PROVIDER_SITE_OTHER): Payer: Self-pay | Admitting: *Deleted

## 2020-08-15 ENCOUNTER — Other Ambulatory Visit (INDEPENDENT_AMBULATORY_CARE_PROVIDER_SITE_OTHER): Payer: Self-pay | Admitting: *Deleted

## 2020-08-15 DIAGNOSIS — K51019 Ulcerative (chronic) pancolitis with unspecified complications: Secondary | ICD-10-CM

## 2020-08-15 DIAGNOSIS — R197 Diarrhea, unspecified: Secondary | ICD-10-CM

## 2020-08-15 NOTE — Telephone Encounter (Signed)
Patient was called and given the results per Dr.Rehman. C-Diff negative , No Antibodies to Humira, the trough level was 4. Dr.Rehman ask that the patient be made aware that he will be calling her later today to further discuss these results.  Patient was called and given results as noted above. Patient states that around 07-26-2020 she started to feel better. Her bowel movements are better. She has no blood , no inflammation. She states that once in a while she may have the urgency to go to the bathroom.  Patient also states that the best time to call her back will be between 2-4 pm today. Dr.Rehman was made aware.

## 2020-08-27 ENCOUNTER — Encounter (INDEPENDENT_AMBULATORY_CARE_PROVIDER_SITE_OTHER): Payer: Self-pay

## 2020-09-02 ENCOUNTER — Encounter (INDEPENDENT_AMBULATORY_CARE_PROVIDER_SITE_OTHER): Payer: Self-pay

## 2020-10-03 ENCOUNTER — Other Ambulatory Visit (INDEPENDENT_AMBULATORY_CARE_PROVIDER_SITE_OTHER): Payer: Self-pay | Admitting: *Deleted

## 2020-10-03 DIAGNOSIS — K51019 Ulcerative (chronic) pancolitis with unspecified complications: Secondary | ICD-10-CM

## 2020-10-03 DIAGNOSIS — R197 Diarrhea, unspecified: Secondary | ICD-10-CM

## 2020-10-16 LAB — COMPREHENSIVE METABOLIC PANEL
AG Ratio: 1.4 (calc) (ref 1.0–2.5)
ALT: 16 U/L (ref 6–29)
AST: 16 U/L (ref 10–35)
Albumin: 4.2 g/dL (ref 3.6–5.1)
Alkaline phosphatase (APISO): 41 U/L (ref 37–153)
BUN: 17 mg/dL (ref 7–25)
CO2: 27 mmol/L (ref 20–32)
Calcium: 9.4 mg/dL (ref 8.6–10.4)
Chloride: 106 mmol/L (ref 98–110)
Creat: 0.67 mg/dL (ref 0.50–1.05)
Globulin: 3.1 g/dL (calc) (ref 1.9–3.7)
Glucose, Bld: 55 mg/dL — ABNORMAL LOW (ref 65–139)
Potassium: 4.2 mmol/L (ref 3.5–5.3)
Sodium: 139 mmol/L (ref 135–146)
Total Bilirubin: 0.4 mg/dL (ref 0.2–1.2)
Total Protein: 7.3 g/dL (ref 6.1–8.1)

## 2020-10-16 LAB — CBC WITH DIFFERENTIAL/PLATELET
Absolute Monocytes: 466 cells/uL (ref 200–950)
Basophils Absolute: 42 cells/uL (ref 0–200)
Basophils Relative: 1 %
Eosinophils Absolute: 0 cells/uL — ABNORMAL LOW (ref 15–500)
Eosinophils Relative: 0 %
HCT: 37.5 % (ref 35.0–45.0)
Hemoglobin: 12.2 g/dL (ref 11.7–15.5)
Lymphs Abs: 2058 cells/uL (ref 850–3900)
MCH: 29.5 pg (ref 27.0–33.0)
MCHC: 32.5 g/dL (ref 32.0–36.0)
MCV: 90.8 fL (ref 80.0–100.0)
MPV: 11.5 fL (ref 7.5–12.5)
Monocytes Relative: 11.1 %
Neutro Abs: 1634 cells/uL (ref 1500–7800)
Neutrophils Relative %: 38.9 %
Platelets: 261 10*3/uL (ref 140–400)
RBC: 4.13 10*6/uL (ref 3.80–5.10)
RDW: 11.9 % (ref 11.0–15.0)
Total Lymphocyte: 49 %
WBC: 4.2 10*3/uL (ref 3.8–10.8)

## 2020-10-22 ENCOUNTER — Other Ambulatory Visit: Payer: Self-pay

## 2020-10-22 ENCOUNTER — Encounter (INDEPENDENT_AMBULATORY_CARE_PROVIDER_SITE_OTHER): Payer: Self-pay | Admitting: Internal Medicine

## 2020-10-22 ENCOUNTER — Ambulatory Visit (INDEPENDENT_AMBULATORY_CARE_PROVIDER_SITE_OTHER): Payer: 59 | Admitting: Internal Medicine

## 2020-10-22 VITALS — BP 112/72 | HR 80 | Temp 97.9°F | Ht 64.0 in | Wt 120.5 lb

## 2020-10-22 DIAGNOSIS — K5100 Ulcerative (chronic) pancolitis without complications: Secondary | ICD-10-CM | POA: Diagnosis not present

## 2020-10-22 MED ORDER — MESALAMINE ER 0.375 G PO CP24: ORAL_CAPSULE | ORAL | 5 refills | Status: DC

## 2020-10-22 NOTE — Patient Instructions (Signed)
Physician will call with results of fecal calprotectin when completed

## 2020-10-22 NOTE — Progress Notes (Signed)
Presenting complaint;  Follow-up for ulcerative colitis.  Database and subjective:  Patient is 59-year-old Caucasian female who is here for scheduled visit. She was diagnosed with distal ulcerative colitis by Dr. Rourk in July 2010.  She was next treated with hydrocortisone enemas and begun on oral mesalamine.  Subsequently 6-MP was added but she developed hepatitis and 6-MP was discontinued in October 2014.  She was maintained on oral mesalamine and required prednisone therapy for intermittent flareups. She had colonoscopy in December 2020 to assess disease activity.  Terminal ileum cecum and proximal ascending colon were normal.  She was noted to have disease extending from rectum all the way to distal ascending colon.  Biopsies revealed active disease in immunohistochemical stains for CMV were negative. She was begun on Humira/adalimumab on January 31, 2020 and has remained on it. She also has been maintained on oral mesalamine. She was last seen on 04/23/2020 when she was coming off prednisone.  She was off prednisone on 05/01/2020. Patient called her office on 06/10/2020 planing of loose stools and noticed some blood.  C. difficile testing for antigen and toxin was negative.  She was begun on budesonide for total of 6 weeks She had adalimumab drug and antibody levels in December, 21.  Antibody level was undetectable and drug level was felt to be within desirable range.  She is advised to continue biologic and oral mesalamine.  Patient states she is feeling back to normal.  She is having 1 stool daily.  Two thirds of her stools are formed and one third are soft.  She has not passed any blood per rectum in about 4 months.  She denies abdominal pain or cramping.  Her appetite is good.  She has gained 2 pounds since her last visit.  She states she is happy with current weight. She was begun on alendronate by her endocrinologist and she is not having any side effects. [facility]  and she lifts weights at home. She may take Tylenol occasionally for pain but does not take OTC NSAIDs. She had blood test and stool test prior to visit as planned   Current Medications: Outpatient Encounter Medications as of 10/22/2020  Medication Sig  . alendronate (FOSAMAX) 70 MG tablet Take 70 mg by mouth once a week. Take with a full glass of water on an empty stomach.  . Biotin w/ Vitamins C & E (HAIR/SKIN/NAILS PO) Take 5,000 mcg by mouth 2 (two) times daily.  . Calcium Carb-Cholecalciferol (CALCIUM + VITAMIN D3 PO) Take by mouth 2 (two) times daily. Calicum 400 mg, Vitamin D3 500 IU  . Cholecalciferol (VITAMIN D3 PO) Take 1,000 Units by mouth 2 (two) times daily. This is a gel capsule.  . HUMIRA PEN 40 MG/0.4ML PNKT SMARTSIG:40 Milligram(s) SUB-Q Every 2 Weeks  . Krill Oil 1000 MG CAPS Take 1 capsule (1,000 mg total) by mouth 2 (two) times daily. (Patient taking differently: Take 500 mg by mouth 2 (two) times daily.)  . mesalamine (APRISO) 0.375 g 24 hr capsule TAKE 4 CAPSULES BY MOUTH EVERY DAY  . OVER THE COUNTER MEDICATION Orgain - Collagen Peptides (10 grams of collagen peptides) - patient uses 1 scoop in a drink or her food daily.  . [DISCONTINUED] budesonide (ENTOCORT EC) 3 MG 24 hr capsule TAKE 3 CAPSULES DAILY FOR 2 WEEKS, 2 CAPSULES DAILY FOR 2 WEEKS THEN 1 CAPSULE DAILY FOR 2 WEEKS (Patient not taking: Reported on 10/22/2020)  . [DISCONTINUED] CALCIUM PO Take   200 mg by mouth daily. (Patient not taking: Reported on 10/22/2020)   No facility-administered encounter medications on file as of 10/22/2020.     Objective: Blood pressure 112/72, pulse 80, temperature 97.9 F (36.6 C), temperature source Oral, height 5' 4" (1.626 m), weight 120 lb 8 oz (54.7 kg). Patient is alert and in no acute distress Conjunctiva is pink. Sclera is nonicteric Oropharyngeal mucosa is normal. No neck masses or thyromegaly noted. Cardiac exam with regular rhythm normal S1 and S2. No murmur or gallop  noted. Lungs are clear to auscultation. Abdomen is flat and soft.  She has mild tenderness in right lower quadrant where percussion note is very tympanic suggesting gas in colon.  No organomegaly or masses No LE edema or clubbing noted.  Labs/studies Results:  CBC Latest Ref Rng & Units 10/16/2020 03/18/2020 01/02/2020  WBC 3.8 - 10.8 Thousand/uL 4.2 6.0 8.7  Hemoglobin 11.7 - 15.5 g/dL 12.2 13.0 12.1  Hematocrit 35.0 - 45.0 % 37.5 40.3 37.1  Platelets 140 - 400 Thousand/uL 261 282 337    CMP Latest Ref Rng & Units 10/16/2020 03/18/2020 01/02/2020  Glucose 65 - 139 mg/dL 55(L) 95 98  BUN 7 - 25 mg/dL 17 14 17  Creatinine 0.50 - 1.05 mg/dL 0.67 0.74 0.78  Sodium 135 - 146 mmol/L 139 138 138  Potassium 3.5 - 5.3 mmol/L 4.2 4.7 4.3  Chloride 98 - 110 mmol/L 106 101 103  CO2 20 - 32 mmol/L 27 24 30  Calcium 8.6 - 10.4 mg/dL 9.4 10.2 9.7  Total Protein 6.1 - 8.1 g/dL 7.3 7.5 7.4  Total Bilirubin 0.2 - 1.2 mg/dL 0.4 0.3 0.5  Alkaline Phos 48 - 121 IU/L - 67 -  AST 10 - 35 U/L 16 16 13  ALT 6 - 29 U/L 16 10 16    Hepatic Function Latest Ref Rng & Units 10/16/2020 03/18/2020 01/02/2020  Total Protein 6.1 - 8.1 g/dL 7.3 7.5 7.4  Albumin 3.8 - 4.9 g/dL - 4.6 -  AST 10 - 35 U/L 16 16 13  ALT 6 - 29 U/L 16 10 16  Alk Phosphatase 48 - 121 IU/L - 67 -  Total Bilirubin 0.2 - 1.2 mg/dL 0.4 0.3 0.5  Bilirubin, Direct 0.0 - 0.3 mg/dL - - -    Fecal calprotectin from 10/18/2020    Lab data from 07/30/2020 Adalimumab drug level 6.2 mcg/ML Adalimumab  antibody level less than 10 AU Fecal calprotectin was 548  Assessment:  #1.  Pan ulcerative colitis.  Her disease transitioned from left-sided colitis(July 2010) to pancolitis on colonoscopy of December 2020 she is presently on Humira/adalimumab and oral mesalamine and appears to be remained in remission.  Hopefully this clinical impression would be corroborated by normalization of fecal calprotectin.  Plan:  New prescription for Apriso sent to patient's  pharmacy for 1 month with 5 refills. Patient will continue Humira/adalimumab at 40 mg subcu every 14 days. Patient will be contacted when fecal calprotectin result available. Office visit in 6 months.      

## 2020-10-24 LAB — CALPROTECTIN: Calprotectin: 51 mcg/g

## 2020-10-28 ENCOUNTER — Other Ambulatory Visit (INDEPENDENT_AMBULATORY_CARE_PROVIDER_SITE_OTHER): Payer: Self-pay | Admitting: *Deleted

## 2020-10-28 DIAGNOSIS — R197 Diarrhea, unspecified: Secondary | ICD-10-CM

## 2020-10-28 DIAGNOSIS — K51019 Ulcerative (chronic) pancolitis with unspecified complications: Secondary | ICD-10-CM

## 2020-10-28 DIAGNOSIS — K5100 Ulcerative (chronic) pancolitis without complications: Secondary | ICD-10-CM

## 2020-10-28 MED ORDER — HUMIRA (2 PEN) 40 MG/0.4ML ~~LOC~~ AJKT: 40.0000 mg | AUTO-INJECTOR | SUBCUTANEOUS | 11 refills | Status: DC

## 2020-11-04 ENCOUNTER — Encounter (INDEPENDENT_AMBULATORY_CARE_PROVIDER_SITE_OTHER): Payer: Self-pay

## 2020-12-19 ENCOUNTER — Encounter: Payer: Self-pay | Admitting: Family Medicine

## 2020-12-19 ENCOUNTER — Ambulatory Visit (INDEPENDENT_AMBULATORY_CARE_PROVIDER_SITE_OTHER): Payer: 59 | Admitting: Family Medicine

## 2020-12-19 VITALS — HR 98 | Temp 98.9°F | Ht 64.0 in

## 2020-12-19 DIAGNOSIS — J301 Allergic rhinitis due to pollen: Secondary | ICD-10-CM

## 2020-12-19 DIAGNOSIS — B9689 Other specified bacterial agents as the cause of diseases classified elsewhere: Secondary | ICD-10-CM | POA: Diagnosis not present

## 2020-12-19 DIAGNOSIS — J019 Acute sinusitis, unspecified: Secondary | ICD-10-CM

## 2020-12-19 MED ORDER — AMOXICILLIN-POT CLAVULANATE 875-125 MG PO TABS: 1.0000 | ORAL_TABLET | Freq: Two times a day (BID) | ORAL | 0 refills | Status: DC

## 2020-12-19 MED ORDER — CETIRIZINE HCL 10 MG PO TABS: 10.0000 mg | ORAL_TABLET | Freq: Every day | ORAL | 1 refills | Status: DC

## 2020-12-19 MED ORDER — FLUTICASONE PROPIONATE 50 MCG/ACT NA SUSP: 2.0000 | Freq: Every day | NASAL | 1 refills | Status: DC

## 2020-12-19 NOTE — Progress Notes (Signed)
Patient ID: Yvonne [name], female    DOB: September 03, 1960, 60 y.o.   MRN: [id number]   Chief Complaint  Patient presents with  . sinus congestion    2 to 3 weeks headache and pain behind eyes, congestion going to chest - yellow phlegm noticed-  taking otc sudafed, alka seltzer, sinex nasal spray Negative home covid test   Subjective:  CC: headache, congestion, pain behind eyes   This is a new problem.  Presents today for an acute visit with a complaint of headache, and pain behind the eyes, congestion.  Symptoms have been present for 2 to 3 weeks.  Has tried Sudafed, Alka-Seltzer, nasal spray without relief.  Took a home COVID test which was negative.  Has an issue with sinus infections in the past.  Denies fever, chills, chest pain, shortness of breath.  Endorses congestion, postnasal drip, sinus pain and pressure, sneezing, cough and headaches across the forehead.    Medical History Yvonne [name] has a past medical history of Anemia, HLD (hyperlipidemia), Osteoporosis, and Ulcerative colitis.   Outpatient Encounter Medications as of 12/19/2020  Medication Sig  . amoxicillin-clavulanate (AUGMENTIN) 875-125 MG tablet Take 1 tablet by mouth 2 (two) times daily.  . cetirizine (ZYRTEC ALLERGY) 10 MG tablet Take 1 tablet (10 mg total) by mouth daily.  . fluticasone (FLONASE) 50 MCG/ACT nasal spray Place 2 sprays into both nostrils daily.  Marland Kitchen alendronate (FOSAMAX) 70 MG tablet Take 70 mg by mouth once a week. Take with a full glass of water on an empty stomach.  . Biotin w/ Vitamins C & E (HAIR/SKIN/NAILS PO) Take 5,000 mcg by mouth 2 (two) times daily.  . Calcium Carb-Cholecalciferol (CALCIUM + VITAMIN D3 PO) Take by mouth 2 (two) times daily. Calicum 144 mg, Vitamin D3 500 IU  . Cholecalciferol (VITAMIN D3 PO) Take 1,000 Units by mouth 2 (two) times daily. This is a gel capsule.  Marland Kitchen HUMIRA PEN 40 MG/0.4ML PNKT Inject 40 mg into the skin every 14 (fourteen) days.  Javier Docker Oil 1000 MG CAPS Take 1 capsule  (1,000 mg total) by mouth 2 (two) times daily. (Patient taking differently: Take 500 mg by mouth 2 (two) times daily.)  . mesalamine (APRISO) 0.375 g 24 hr capsule TAKE 4 CAPSULES BY MOUTH EVERY DAY  . OVER THE COUNTER MEDICATION Orgain - Collagen Peptides (10 grams of collagen peptides) - patient uses 1 scoop in a drink or her food daily.   No facility-administered encounter medications on file as of 12/19/2020.     Review of Systems  Constitutional: Negative for chills and fever.  HENT: Positive for congestion, postnasal drip, sinus pressure, sinus pain and sneezing. Negative for ear pain.   Respiratory: Positive for cough. Negative for shortness of breath.   Cardiovascular: Negative for chest pain.  Neurological: Positive for headaches.       Across forehead.      Vitals Pulse 98   Temp 98.9 F (37.2 C)   Ht 5' 4"  (1.626 m)   SpO2 98%   BMI 20.68 kg/m   Objective:   Physical Exam Vitals reviewed.  Constitutional:      Appearance: Normal appearance.  HENT:     Right Ear: Tympanic membrane normal.     Left Ear: Tympanic membrane normal.     Nose:     Right Turbinates: Swollen.     Left Turbinates: Swollen.     Right Sinus: Maxillary sinus tenderness and frontal sinus tenderness present.     Left Sinus:  Maxillary sinus tenderness and frontal sinus tenderness present.     Mouth/Throat:     Pharynx: Posterior oropharyngeal erythema present. No oropharyngeal exudate.     Tonsils: 0 on the right. 0 on the left.  Cardiovascular:     Rate and Rhythm: Normal rate and regular rhythm.     Heart sounds: Normal heart sounds.  Pulmonary:     Effort: Pulmonary effort is normal.     Breath sounds: Normal breath sounds.  Skin:    General: Skin is warm and dry.  Neurological:     General: No focal deficit present.     Mental Status: She is alert.  Psychiatric:        Behavior: Behavior normal.      Assessment and Plan   1. Acute bacterial rhinosinusitis -  amoxicillin-clavulanate (AUGMENTIN) 875-125 MG tablet; Take 1 tablet by mouth 2 (two) times daily.  Dispense: 20 tablet; Refill: 0  2. Seasonal allergic rhinitis due to pollen - cetirizine (ZYRTEC ALLERGY) 10 MG tablet; Take 1 tablet (10 mg total) by mouth daily.  Dispense: 30 tablet; Refill: 1 - fluticasone (FLONASE) 50 MCG/ACT nasal spray; Place 2 sprays into both nostrils daily.  Dispense: 16 g; Refill: 1   Will treat sinus infection with Augmentin twice per day for 10 days.  Seasonal allergies she will start Zyrtec and Flonase during pollen season.  Recommend sinus rinses and supportive therapy.  Agrees with plan of care discussed today. Understands warning signs to seek further care: chest pain, shortness of breath, any significant change in health.  Understands to follow-up if symptoms do not improve, or worsen.  If continues to have sinus issues, recommend ENT referral.     Pecolia Ades, NP 12/19/2020

## 2020-12-19 NOTE — Patient Instructions (Signed)
How to Perform a Sinus Rinse A sinus rinse is a home treatment. It rinses your sinuses with a mixture of salt and water (saline solution). Sinuses are air-filled spaces in your skull behind the bones of your face and forehead. They open into your nasal cavity. A sinus rinse can help to clear your nasal cavity. It can clear mucus, dirt, dust, or pollen. You may do a sinus rinse when you have:  A cold.  A virus.  Allergies.  A sinus infection.  A stuffy nose. Talk with your doctor about whether a sinus rinse might help you. What are the risks? A sinus rinse is normally very safe and helpful. However, there are a few risks. These include:  A burning feeling in the sinuses. This may happen if you do not make the saline solution as instructed. Be sure to follow all directions when making the saline solution.  Nasal irritation.  Infection from unclean water. This is rare, but possible. Do not do a sinus rinse if you have had:  Ear or nasal surgery.  An ear infection.  Blocked ears. Supplies needed:  Saline solution or powder.  Distilled or germ-free (sterile) water may be needed to mix with saline powder. ? You may use boiled and cooled tap water. Boil tap water for 5 minutes; cool until it is lukewarm. Use within 24 hours. ? Do not use regular tap water to mix with the saline solution.  Neti pot or nasal rinse bottle. This releases the saline solution into your nose and through your sinuses. You can buy neti pots and rinse bottles: ? At your local pharmacy. ? At a health food store. ? Online. How to perform a sinus rinse 1. Wash your hands with soap and water. 2. Wash your device using the directions that came with it. 3. Dry your device. 4. Use the solution that comes with your device or one that is sold separately in stores. Follow the mixing directions on the package if you need to mix with sterile or distilled water. 5. Fill your device with the amount of saline solution  stated in the device instructions. 6. Stand over a sink and tilt your head sideways over the sink. 7. Place the spout of the device in your upper nostril (the one closer to the ceiling). 8. Gently pour or squeeze the saline solution into your nasal cavity. The liquid should drain to your lower nostril if you are not too stuffed up (congested). 9. While rinsing, breathe through your open mouth. 10. Gently blow your nose to clear any mucus and rinse solution. Blowing too hard may cause ear pain. 11. Repeat in your other nostril. 12. Clean and rinse your device with clean water. 13. Air-dry your device. Talk with your doctor or pharmacist if you have questions about how to do a sinus rinse.   Summary  A sinus rinse is a home treatment. It rinses your sinuses with a mixture of salt and water (saline solution).  A sinus rinse is normally very safe and helpful. Follow all instructions carefully.  Talk with your doctor about whether a sinus rinse might help you. This information is not intended to replace advice given to you by your health care provider. Make sure you discuss any questions you have with your health care provider. Document Revised: 05/14/2020 Document Reviewed: 05/14/2020 Elsevier Patient Education  2021 [employer].

## 2021-04-29 ENCOUNTER — Other Ambulatory Visit: Payer: Self-pay

## 2021-04-29 ENCOUNTER — Encounter (INDEPENDENT_AMBULATORY_CARE_PROVIDER_SITE_OTHER): Payer: Self-pay | Admitting: Internal Medicine

## 2021-04-29 ENCOUNTER — Ambulatory Visit (INDEPENDENT_AMBULATORY_CARE_PROVIDER_SITE_OTHER): Payer: 59 | Admitting: Internal Medicine

## 2021-04-29 VITALS — BP 117/67 | HR 86 | Temp 97.7°F | Ht 64.0 in | Wt 119.6 lb

## 2021-04-29 DIAGNOSIS — K5100 Ulcerative (chronic) pancolitis without complications: Secondary | ICD-10-CM

## 2021-04-29 MED ORDER — MESALAMINE ER 0.375 G PO CP24: ORAL_CAPSULE | ORAL | 5 refills | Status: DC

## 2021-04-29 NOTE — Progress Notes (Signed)
Presenting complaint;  Follow-up for ulcerative colitis.  Database and subjective:  Patient is 60 year old Caucasian female with 12-year history of ulcerative colitis who is here for scheduled visit.  She was last seen 6 months ago. Her last colonoscopy was in December 2020. She has been on Humira as since June 2021.  She says she is doing well.  She has 1 formed stool daily.  She denies abdominal pain melena or rectal bleeding.  Her appetite is very good.  [facility]. She has osteoporosis.  Last bone Zenda the study was in September 2021.  Z score at [facility] was -2.9%.  She has been on Fosamax since November 2021. Patient has received all 3 doses of COVID-vaccine and she never had COVID infection  Current Medications: Outpatient Encounter Medications as of 04/29/2021  Medication Sig   alendronate (FOSAMAX) 70 MG tablet Take 70 mg by mouth once a week. Take with a full glass of water on an empty stomach.   Biotin w/ Vitamins C & E (HAIR/SKIN/NAILS PO) Take 5,000 mcg by mouth 2 (two) times daily.   Calcium Carb-Cholecalciferol (CALCIUM + VITAMIN D3 PO) Take by mouth 2 (two) times daily. Calicum 161 mg, Vitamin D3 500 IU   cetirizine (ZYRTEC ALLERGY) 10 MG tablet Take 1 tablet (10 mg total) by mouth daily.   Cholecalciferol (VITAMIN D3 PO) Take 1,000 Units by mouth 2 (two) times daily. This is a gel capsule.   HUMIRA PEN 40 MG/0.4ML PNKT Inject 40 mg into the skin every 14 (fourteen) days.   Krill Oil 1000 MG CAPS Take 1 capsule (1,000 mg total) by mouth 2 (two) times daily. (Patient taking differently: Take 500 mg by mouth 2 (two) times daily.)   mesalamine (APRISO) 0.375 g 24 hr capsule TAKE 4 CAPSULES BY MOUTH EVERY DAY   OVER THE COUNTER MEDICATION Orgain - Collagen Peptides (10 grams of collagen peptides) - patient uses 1 scoop in a drink or her food daily.   [DISCONTINUED] amoxicillin-clavulanate (AUGMENTIN) 875-125 MG tablet Take 1 tablet by mouth 2 (two) times  daily.   [DISCONTINUED] fluticasone (FLONASE) 50 MCG/ACT nasal spray Place 2 sprays into both nostrils daily.   No facility-administered encounter medications on file as of 04/29/2021.     Objective: Blood pressure 117/67, pulse 86, temperature 97.7 F (36.5 C), temperature source Oral, height 5' 4"  (1.626 m), weight 119 lb 9.6 oz (54.3 kg). Patient is alert and in no acute distress Conjunctiva is pink. Sclera is nonicteric Oropharyngeal mucosa is normal. No neck masses or thyromegaly noted. Cardiac exam with regular rhythm normal S1 and S2. No murmur or gallop noted. Lungs are clear to auscultation. Abdomen is symmetrical soft and nontender without organomegaly or masses. No LE edema or clubbing noted.  Labs/studies Results:   CBC Latest Ref Rng & Units 10/16/2020 03/18/2020 01/02/2020  WBC 3.8 - 10.8 Thousand/uL 4.2 6.0 8.7  Hemoglobin 11.7 - 15.5 g/dL 12.2 13.0 12.1  Hematocrit 35.0 - 45.0 % 37.5 40.3 37.1  Platelets 140 - 400 Thousand/uL 261 282 337    CMP Latest Ref Rng & Units 10/16/2020 03/18/2020 01/02/2020  Glucose 65 - 139 mg/dL 55(L) 95 98  BUN 7 - 25 mg/dL 17 14 17   Creatinine 0.50 - 1.05 mg/dL 0.67 0.74 0.78  Sodium 135 - 146 mmol/L 139 138 138  Potassium 3.5 - 5.3 mmol/L 4.2 4.7 4.3  Chloride 98 - 110 mmol/L 106 101 103  CO2 20 - 32 mmol/L 27  24 30  Calcium 8.6 - 10.4 mg/dL 9.4 10.2 9.7  Total Protein 6.1 - 8.1 g/dL 7.3 7.5 7.4  Total Bilirubin 0.2 - 1.2 mg/dL 0.4 0.3 0.5  Alkaline Phos 48 - 121 IU/L - 67 -  AST 10 - 35 U/L 16 16 13   ALT 6 - 29 U/L 16 10 16     Hepatic Function Latest Ref Rng & Units 10/16/2020 03/18/2020 01/02/2020  Total Protein 6.1 - 8.1 g/dL 7.3 7.5 7.4  Albumin 3.8 - 4.9 g/dL - 4.6 -  AST 10 - 35 U/L 16 16 13   ALT 6 - 29 U/L 16 10 16   Alk Phosphatase 48 - 121 IU/L - 67 -  Total Bilirubin 0.2 - 1.2 mg/dL 0.4 0.3 0.5  Bilirubin, Direct 0.0 - 0.3 mg/dL - - -    Lab Results  Component Value Date   CRP 0.9 01/02/2020    Fecal calprotectin was 51  on 10/18/2020. Fecal calprotectin was 548 on 07/30/2020.   Assessment:  #1.  Pan ulcerative colitis appears to be in remission.  Fecal calprotectin 6 months ago was at upper limit of normal.  She is on oral mesalamine and Humira/adalimumab which she is tolerating well.  #2.  History of osteoporosis secondary to prior steroid therapy and chronic condition.  She is being managed by endocrinologist.   Plan:  Continue Apriso/mesalamine 1500 mg p.o. daily.  New prescription sent to patient's pharmacy. Continue Humira/adalimumab at 40 mg subcu every 14 days. Patient will go to the lab for CBC and comprehensive chemistry panel. Office visit in 6 months. Patient will call office if she has abdominal pain rectal bleeding or diarrhea.

## 2021-04-29 NOTE — Patient Instructions (Signed)
Physician will call with results of blood test when completed. Notify if you have rectal bleeding or diarrhea.

## 2021-04-30 LAB — COMPREHENSIVE METABOLIC PANEL
AG Ratio: 1.4 (calc) (ref 1.0–2.5)
ALT: 12 U/L (ref 6–29)
AST: 17 U/L (ref 10–35)
Albumin: 4.5 g/dL (ref 3.6–5.1)
Alkaline phosphatase (APISO): 46 U/L (ref 37–153)
BUN: 15 mg/dL (ref 7–25)
CO2: 30 mmol/L (ref 20–32)
Calcium: 9.7 mg/dL (ref 8.6–10.4)
Chloride: 105 mmol/L (ref 98–110)
Creat: 0.63 mg/dL (ref 0.50–1.03)
Globulin: 3.3 g/dL (calc) (ref 1.9–3.7)
Glucose, Bld: 87 mg/dL (ref 65–99)
Potassium: 4.4 mmol/L (ref 3.5–5.3)
Sodium: 140 mmol/L (ref 135–146)
Total Bilirubin: 0.3 mg/dL (ref 0.2–1.2)
Total Protein: 7.8 g/dL (ref 6.1–8.1)

## 2021-04-30 LAB — CBC WITH DIFFERENTIAL/PLATELET
Absolute Monocytes: 340 cells/uL (ref 200–950)
Basophils Absolute: 30 cells/uL (ref 0–200)
Basophils Relative: 0.7 %
Eosinophils Absolute: 9 cells/uL — ABNORMAL LOW (ref 15–500)
Eosinophils Relative: 0.2 %
HCT: 39.2 % (ref 35.0–45.0)
Hemoglobin: 12.6 g/dL (ref 11.7–15.5)
Lymphs Abs: 1797 cells/uL (ref 850–3900)
MCH: 29.4 pg (ref 27.0–33.0)
MCHC: 32.1 g/dL (ref 32.0–36.0)
MCV: 91.4 fL (ref 80.0–100.0)
MPV: 11.5 fL (ref 7.5–12.5)
Monocytes Relative: 7.9 %
Neutro Abs: 2124 cells/uL (ref 1500–7800)
Neutrophils Relative %: 49.4 %
Platelets: 281 10*3/uL (ref 140–400)
RBC: 4.29 10*6/uL (ref 3.80–5.10)
RDW: 12.2 % (ref 11.0–15.0)
Total Lymphocyte: 41.8 %
WBC: 4.3 10*3/uL (ref 3.8–10.8)

## 2021-06-09 ENCOUNTER — Encounter: Payer: Self-pay | Admitting: Family Medicine

## 2021-07-02 ENCOUNTER — Telehealth: Payer: Self-pay | Admitting: Family Medicine

## 2021-07-02 DIAGNOSIS — E7849 Other hyperlipidemia: Secondary | ICD-10-CM

## 2021-07-02 NOTE — Telephone Encounter (Signed)
Patient has appointment on 12/8. Would like labs the week before to check cholesterol. Please advise.  CB#: [phone]

## 2021-07-02 NOTE — Telephone Encounter (Signed)
Vit D completed 05/28/20; CBC, CMP, Lipid completed 03/18/20. Please advise. Thank you

## 2021-07-02 NOTE — Telephone Encounter (Signed)
Lab orders placed and pt is aware 

## 2021-07-02 NOTE — Telephone Encounter (Signed)
Lipid profile

## 2021-07-22 LAB — LIPID PANEL
Chol/HDL Ratio: 4.6 ratio — ABNORMAL HIGH (ref 0.0–4.4)
Cholesterol, Total: 320 mg/dL — ABNORMAL HIGH (ref 100–199)
HDL: 70 mg/dL (ref >39)
LDL Chol Calc (NIH): 232 mg/dL — ABNORMAL HIGH (ref 0–99)
Triglycerides: 105 mg/dL (ref 0–149)
VLDL Cholesterol Cal: 18 mg/dL (ref 5–40)

## 2021-07-24 ENCOUNTER — Other Ambulatory Visit: Payer: Self-pay

## 2021-07-24 ENCOUNTER — Encounter: Payer: Self-pay | Admitting: Family Medicine

## 2021-07-24 ENCOUNTER — Ambulatory Visit (INDEPENDENT_AMBULATORY_CARE_PROVIDER_SITE_OTHER): Payer: 59 | Admitting: Family Medicine

## 2021-07-24 VITALS — BP 116/76 | HR 73 | Temp 97.3°F | Ht 64.0 in | Wt 119.0 lb

## 2021-07-24 DIAGNOSIS — E7849 Other hyperlipidemia: Secondary | ICD-10-CM

## 2021-07-24 MED ORDER — ROSUVASTATIN CALCIUM 10 MG PO TABS: 10.0000 mg | ORAL_TABLET | Freq: Every day | ORAL | 1 refills | Status: DC

## 2021-07-24 NOTE — Patient Instructions (Addendum)
Please send [place] a MyChart message within a few weeks letting me know how well you are tolerating the medicine.  Please also do your lab work in 8 to 10 weeks at [place] refers to food and lifestyle choices that are based on the traditions of countries located on the [employer]. It focuses on eating more fruits, vegetables, whole grains, beans, nuts, seeds, and heart-healthy fats, and eating less dairy, meat, eggs, and processed foods with added sugar, salt, and fat. This way of eating has been shown to help prevent certain conditions and improve outcomes for people who have chronic diseases, like kidney disease and heart disease. What are tips for following this plan? Reading food labels Check the serving size of packaged foods. For foods such as rice and pasta, the serving size refers to the amount of cooked product, not dry. Check the total fat in packaged foods. Avoid foods that have saturated fat or trans fats. Check the ingredient list for added sugars, such as corn syrup. Shopping  Buy a variety of foods that offer a balanced diet, including: Fresh fruits and vegetables (produce). Grains, beans, nuts, and seeds. Some of these may be available in unpackaged forms or large amounts (in bulk). Fresh seafood. Poultry and eggs. Low-fat dairy products. Buy whole ingredients instead of prepackaged foods. Buy fresh fruits and vegetables in-season from local farmers markets. Buy plain frozen fruits and vegetables. If you do not have access to quality fresh seafood, buy precooked frozen shrimp or canned fish, such as tuna, salmon, or sardines. Stock your pantry so you always have certain foods on hand, such as olive oil, canned tuna, canned tomatoes, rice, pasta, and beans. Cooking Cook foods with extra-virgin olive oil instead of using butter or other vegetable oils. Have meat as a side dish, and have vegetables  or grains as your main dish. This means having meat in small portions or adding small amounts of meat to foods like pasta or stew. Use beans or vegetables instead of meat in common dishes like chili or lasagna. Experiment with different cooking methods. Try roasting, broiling, steaming, and sauting vegetables. Add frozen vegetables to soups, stews, pasta, or rice. Add nuts or seeds for added healthy fats and plant protein at each meal. You can add these to yogurt, salads, or vegetable dishes. Marinate fish or vegetables using olive oil, lemon juice, garlic, and fresh herbs. Meal planning Plan to eat one vegetarian meal one day each week. Try to work up to two vegetarian meals, if possible. Eat seafood two or more times a week. Have healthy snacks readily available, such as: Vegetable sticks with hummus. Greek yogurt. Fruit and nut trail mix. Eat balanced meals throughout the week. This includes: Fruit: 2-3 servings a day. Vegetables: 4-5 servings a day. Low-fat dairy: 2 servings a day. Fish, poultry, or lean meat: 1 serving a day. Beans and legumes: 2 or more servings a week. Nuts and seeds: 1-2 servings a day. Whole grains: 6-8 servings a day. Extra-virgin olive oil: 3-4 servings a day. Limit red meat and sweets to only a few servings a month. Lifestyle  Cook and eat meals together with your family, when possible. Drink enough fluid to keep your urine pale yellow. Be physically active every day. This includes: Aerobic exercise like running or swimming. Leisure activities like gardening, walking, or housework. Get 7-8 hours of sleep each night. If recommended by your health care provider, drink red wine in  moderation. This means 1 glass a day for nonpregnant women and 2 glasses a day for men. A glass of wine equals 5 oz (150 mL). What foods should I eat? Fruits Apples. Apricots. Avocado. Berries. Bananas. Cherries. Dates. Figs. Grapes. Lemons. Melon. Oranges. Peaches. Plums.  Pomegranate. Vegetables Artichokes. Beets. Broccoli. Cabbage. Carrots. Eggplant. Green beans. Chard. Kale. Spinach. Onions. Leeks. Peas. Squash. Tomatoes. Peppers. Radishes. Grains Whole-grain pasta. Brown rice. Bulgur wheat. Polenta. Couscous. Whole-wheat bread. [place]. Meats and other proteins Beans. Almonds. Sunflower seeds. Pine nuts. Peanuts. [place]. Salmon. Scallops. Shrimp. [place]. Tilapia. Clams. Oysters. Eggs. Poultry without skin. Dairy Low-fat milk. Cheese. Greek yogurt. Fats and oils Extra-virgin olive oil. Avocado oil. Grapeseed oil. Beverages Water. Red wine. Herbal tea. Sweets and desserts Greek yogurt with honey. Baked apples. Poached pears. Trail mix. Seasonings and condiments Basil. Cilantro. Coriander. Cumin. Mint. Parsley. Sage. Rosemary. Tarragon. Garlic. Oregano. Thyme. Pepper. Balsamic vinegar. Tahini. Hummus. Tomato sauce. Olives. Mushrooms. The items listed above may not be a complete list of foods and beverages you can eat. Contact a dietitian for more information. What foods should I limit? This is a list of foods that should be eaten rarely or only on special occasions. Fruits Fruit canned in syrup. Vegetables Deep-fried potatoes (french fries). Grains Prepackaged pasta or rice dishes. Prepackaged cereal with added sugar. Prepackaged snacks with added sugar. Meats and other proteins Beef. Pork. Lamb. Poultry with skin. Hot dogs. Berniece Salines. Dairy Ice cream. Sour cream. Whole milk. Fats and oils Butter. Canola oil. Vegetable oil. Beef fat (tallow). Lard. Beverages Juice. Sugar-sweetened soft drinks. Beer. Liquor and spirits. Sweets and desserts Cookies. Cakes. Pies. Candy. Seasonings and condiments Mayonnaise. Pre-made sauces and marinades. The items listed above may not be a complete list of foods and beverages you should limit. Contact a dietitian for more information. Summary The Mediterranean diet includes both food and lifestyle choices. Eat a  variety of fresh fruits and vegetables, beans, nuts, seeds, and whole grains. Limit the amount of red meat and sweets that you eat. If recommended by your health care provider, drink red wine in moderation. This means 1 glass a day for nonpregnant women and 2 glasses a day for men. A glass of wine equals 5 oz (150 mL). This information is not intended to replace advice given to you by your health care provider. Make sure you discuss any questions you have with your health care provider. Document Revised: 09/08/2019 Document Reviewed: 07/06/2019 Elsevier Patient Education  2022 [employer].

## 2021-07-24 NOTE — Progress Notes (Signed)
   Subjective:    Patient ID: Yvonne [name], female    DOB: 1960/11/24, 60 y.o.   MRN: [id number]  HPI Hyperlipidemia follow up lab results  The 10-year ASCVD risk score (Arnett DK, et al., 2019) is: 3.6%   Values used to calculate the score:     Age: 60 years     Sex: Female     Is Non-Hispanic African American: No     Diabetic: No     Tobacco smoker: No     Systolic Blood Pressure: 583 mmHg     Is BP treated: No     HDL Cholesterol: 70 mg/dL     Total Cholesterol: 320 mg/dL   Review of Systems     Objective:   Physical Exam General-in no acute distress Eyes-no discharge Lungs-respiratory rate normal, CTA CV-no murmurs,RRR Extremities skin warm dry no edema Neuro grossly normal Behavior normal, alert        Assessment & Plan:   Start crestor Pt desires to start at 10 Had arm myalgia with lipitor in the past Side effects were discussed If she does have side effects we will go with Repatha Patient will do follow-up lab work in 8 to 10 weeks we will send [place] updates

## 2021-07-28 ENCOUNTER — Other Ambulatory Visit: Payer: Self-pay | Admitting: Family Medicine

## 2021-07-28 DIAGNOSIS — Z79899 Other long term (current) drug therapy: Secondary | ICD-10-CM

## 2021-07-28 DIAGNOSIS — E7849 Other hyperlipidemia: Secondary | ICD-10-CM

## 2021-07-28 NOTE — Progress Notes (Signed)
07/28/21-lab orders placed

## 2021-08-05 ENCOUNTER — Other Ambulatory Visit (INDEPENDENT_AMBULATORY_CARE_PROVIDER_SITE_OTHER): Payer: Self-pay | Admitting: *Deleted

## 2021-08-05 DIAGNOSIS — K51019 Ulcerative (chronic) pancolitis with unspecified complications: Secondary | ICD-10-CM

## 2021-08-05 DIAGNOSIS — R197 Diarrhea, unspecified: Secondary | ICD-10-CM

## 2021-08-05 DIAGNOSIS — K5100 Ulcerative (chronic) pancolitis without complications: Secondary | ICD-10-CM

## 2021-08-05 MED ORDER — HUMIRA (2 PEN) 40 MG/0.4ML ~~LOC~~ AJKT: 40.0000 mg | AUTO-INJECTOR | SUBCUTANEOUS | 3 refills | Status: DC

## 2021-08-20 ENCOUNTER — Telehealth (INDEPENDENT_AMBULATORY_CARE_PROVIDER_SITE_OTHER): Payer: Self-pay | Admitting: *Deleted

## 2021-08-20 NOTE — Telephone Encounter (Signed)
Pt states her new insurance [employer] called her and states there is no cost for humira and they will deliver on jan 12th. Pt states she will give me a call back if there are any problems.

## 2021-08-20 NOTE — Telephone Encounter (Signed)
Called and left pt a message to return call. Pt on humira and [place] access field spcialist for [facility] [employer] [phone] called a few weeks ago and states she is coming off their program 1st of jan and she needs new rx sent to [facility]. I did send script at that time. Anderson Malta states they mailed patient a benefit card for her to use 2023 and all she needed [place] to do was send in script. I left message for patient to return call to make sure she was able to get RX and see if she needed anything from [place].

## 2021-08-25 DIAGNOSIS — L814 Other melanin hyperpigmentation: Secondary | ICD-10-CM | POA: Diagnosis not present

## 2021-08-25 DIAGNOSIS — L570 Actinic keratosis: Secondary | ICD-10-CM | POA: Diagnosis not present

## 2021-08-25 DIAGNOSIS — L720 Epidermal cyst: Secondary | ICD-10-CM | POA: Diagnosis not present

## 2021-08-25 DIAGNOSIS — D485 Neoplasm of uncertain behavior of skin: Secondary | ICD-10-CM | POA: Diagnosis not present

## 2021-08-25 DIAGNOSIS — D225 Melanocytic nevi of trunk: Secondary | ICD-10-CM | POA: Diagnosis not present

## 2021-08-25 DIAGNOSIS — L821 Other seborrheic keratosis: Secondary | ICD-10-CM | POA: Diagnosis not present

## 2021-09-07 ENCOUNTER — Encounter: Payer: Self-pay | Admitting: Family Medicine

## 2021-09-11 NOTE — Telephone Encounter (Signed)
I called and left a vm for Yvonne [name] field specialist for Yvonne [name] to see why they are asking the patient to have [place] do an authorization if they are no longer helping patient financially. Patient states she has [employer] now a rx was sent in to [facility] in December 2022.

## 2021-09-11 NOTE — Telephone Encounter (Signed)
Form received today from [facility] asking for rx. Called pt and did say to send to them. Will get dr Laural Golden to sign this afternoon when he comes in.

## 2021-09-11 NOTE — Telephone Encounter (Signed)
Patient called back and states [facility] called her and told her she needs auth from dr Laural Golden for humira. She took one pen yesterday and has one more then will be out.

## 2021-09-11 NOTE — Telephone Encounter (Signed)
I called pharmacy and they do have script but they do not have her new insurance card. I faxed over new card and also asked patient to call pharmacy. Will check on status later today.

## 2021-09-12 NOTE — Telephone Encounter (Signed)
Fax signed by dr Laural Golden and I faxed yesterday. I did PA through cover my meds today and await decision. Patient was notified we are waiting for decision from insurance.

## 2021-09-15 ENCOUNTER — Other Ambulatory Visit (INDEPENDENT_AMBULATORY_CARE_PROVIDER_SITE_OTHER): Payer: Self-pay

## 2021-09-15 DIAGNOSIS — R197 Diarrhea, unspecified: Secondary | ICD-10-CM

## 2021-09-15 DIAGNOSIS — K51019 Ulcerative (chronic) pancolitis with unspecified complications: Secondary | ICD-10-CM

## 2021-09-15 DIAGNOSIS — K5100 Ulcerative (chronic) pancolitis without complications: Secondary | ICD-10-CM

## 2021-09-15 MED ORDER — HUMIRA (2 PEN) 40 MG/0.4ML ~~LOC~~ AJKT: 40.0000 mg | AUTO-INJECTOR | SUBCUTANEOUS | 3 refills | Status: DC

## 2021-09-15 NOTE — Telephone Encounter (Signed)
Request from [place] for a new rx submitted 09/15/2021.

## 2021-09-16 ENCOUNTER — Telehealth (INDEPENDENT_AMBULATORY_CARE_PROVIDER_SITE_OTHER): Payer: Self-pay | Admitting: *Deleted

## 2021-09-16 NOTE — Telephone Encounter (Signed)
[place] left vm that patient needs PA for humira.  Phone number [phone]. I submitted PA on 09/12/21 and checked today still pending.

## 2021-09-16 NOTE — Telephone Encounter (Signed)
I resubmitted information through cover my meds today and I let pt know we are waiting for insurance decision. She has one pen left that she takes tomorrow. (09/17/21)

## 2021-09-19 ENCOUNTER — Encounter (INDEPENDENT_AMBULATORY_CARE_PROVIDER_SITE_OTHER): Payer: Self-pay | Admitting: *Deleted

## 2021-09-23 NOTE — Telephone Encounter (Signed)
Patient states she still has not received her medication. I advised I could give her some samples from the office she states she will pick these up on 09/23/2021 from the office.

## 2021-09-24 ENCOUNTER — Other Ambulatory Visit (INDEPENDENT_AMBULATORY_CARE_PROVIDER_SITE_OTHER): Payer: Self-pay | Admitting: *Deleted

## 2021-09-24 DIAGNOSIS — K5100 Ulcerative (chronic) pancolitis without complications: Secondary | ICD-10-CM

## 2021-09-24 DIAGNOSIS — R197 Diarrhea, unspecified: Secondary | ICD-10-CM

## 2021-09-24 DIAGNOSIS — K51019 Ulcerative (chronic) pancolitis with unspecified complications: Secondary | ICD-10-CM

## 2021-09-25 NOTE — Telephone Encounter (Signed)
Pt picked up sample pens so she will not run out of med. Still working on prior [place]. Spoke with Elmyra Ricks at number [phone] yesterday and she states they need office notes faxed to [phone]. PA number [id number]. Power went out while I was on phone with nicole and I let her know the notes would get faxed when power came back on.

## 2021-09-25 NOTE — Telephone Encounter (Signed)
Called insurance and did pa on phone on 2/8. Notes faxed on 2/9. Await decision.

## 2021-09-26 NOTE — Telephone Encounter (Signed)
I called to check the status of PA and it is currently under review with physician. Submitted on 2/8 and was told to give it 72 hours.

## 2021-09-29 ENCOUNTER — Other Ambulatory Visit (INDEPENDENT_AMBULATORY_CARE_PROVIDER_SITE_OTHER): Payer: Self-pay | Admitting: *Deleted

## 2021-09-29 DIAGNOSIS — K51019 Ulcerative (chronic) pancolitis with unspecified complications: Secondary | ICD-10-CM

## 2021-09-29 DIAGNOSIS — R197 Diarrhea, unspecified: Secondary | ICD-10-CM

## 2021-09-29 DIAGNOSIS — K5100 Ulcerative (chronic) pancolitis without complications: Secondary | ICD-10-CM

## 2021-09-29 MED ORDER — HUMIRA (2 PEN) 40 MG/0.4ML ~~LOC~~ AJKT: 40.0000 mg | AUTO-INJECTOR | SUBCUTANEOUS | 11 refills | Status: DC

## 2021-09-29 NOTE — Telephone Encounter (Signed)
Wasn't this approved?

## 2021-09-29 NOTE — Telephone Encounter (Signed)
Pt notified med is approved and she gave me pharmacy number [phone] to [facility] special[facility]. I called and spoke with pharmacist and gave her a verbal on RX for humira.  Pharmacist states it is the one [place] zip code [phone]. I added pharmacy to chart and let pt know to call and set up account and to call me if any problems.

## 2021-09-29 NOTE — Telephone Encounter (Signed)
Left message to return call with pt

## 2021-09-29 NOTE — Telephone Encounter (Signed)
Approved. Patient and pharmacy was notifed.

## 2021-09-29 NOTE — Telephone Encounter (Signed)
Fax from [facility] [employer]. Humira 24m is approved 09/26/21 - 09/26/22.

## 2021-10-02 DIAGNOSIS — E7849 Other hyperlipidemia: Secondary | ICD-10-CM | POA: Diagnosis not present

## 2021-10-02 DIAGNOSIS — Z79899 Other long term (current) drug therapy: Secondary | ICD-10-CM | POA: Diagnosis not present

## 2021-10-03 LAB — HEPATIC FUNCTION PANEL
ALT: 17 IU/L (ref 0–32)
AST: 24 IU/L (ref 0–40)
Albumin: 4.7 g/dL (ref 3.8–4.9)
Alkaline Phosphatase: 55 IU/L (ref 44–121)
Bilirubin Total: 0.4 mg/dL (ref 0.0–1.2)
Bilirubin, Direct: 0.1 mg/dL (ref 0.00–0.40)
Total Protein: 7.6 g/dL (ref 6.0–8.5)

## 2021-10-03 LAB — LIPID PANEL
Chol/HDL Ratio: 2.6 ratio (ref 0.0–4.4)
Cholesterol, Total: 189 mg/dL (ref 100–199)
HDL: 73 mg/dL (ref >39)
LDL Chol Calc (NIH): 106 mg/dL — ABNORMAL HIGH (ref 0–99)
Triglycerides: 55 mg/dL (ref 0–149)
VLDL Cholesterol Cal: 10 mg/dL (ref 5–40)

## 2021-10-28 ENCOUNTER — Other Ambulatory Visit: Payer: Self-pay

## 2021-10-28 ENCOUNTER — Ambulatory Visit (INDEPENDENT_AMBULATORY_CARE_PROVIDER_SITE_OTHER): Payer: 59 | Admitting: Internal Medicine

## 2021-10-28 ENCOUNTER — Encounter (INDEPENDENT_AMBULATORY_CARE_PROVIDER_SITE_OTHER): Payer: Self-pay | Admitting: Internal Medicine

## 2021-10-28 VITALS — BP 112/72 | HR 69 | Temp 99.1°F | Ht 64.0 in | Wt 121.5 lb

## 2021-10-28 DIAGNOSIS — K5150 Left sided colitis without complications: Secondary | ICD-10-CM

## 2021-10-28 MED ORDER — MESALAMINE ER 0.375 G PO CP24: ORAL_CAPSULE | ORAL | 11 refills | Status: DC

## 2021-10-28 NOTE — Patient Instructions (Signed)
Physician will call with results of Tb test when completed ?

## 2021-10-28 NOTE — Progress Notes (Signed)
Presenting complaint; ? ?Follow-up for ulcerative colitis. ? ?Database and subjective: ? ?Patient is 61 year old Caucasian female who is here for scheduled visit.  She was last seen in September 2022. ?She was diagnosed with ulcerative colitis in July 2010 by Dr. Gala Romney.  To begin with she had distal disease.  She did great with oral mesalamine and 6-MP but then she developed hepatitis and 6-MP was discontinued in October 2014.  Her disease could not be controlled with oral mesalamine and she required prednisone for flareups. ?Colonoscopy in December 2020 revealed active disease of rectum to ascending colon.  Cecum and proximal ascending colon was spared and TI was normal.  Biopsies were negative for CMV.  She was eventually able to start Humira adalimumab in June 2021 and remains on it.  She is also on oral mesalamine. ? ?She states she is doing well.  On most days she has 1 formed stool.  She does not however go every day.  Occasionally she may have 2 formed stools. She denies abdominal pain cramping rectal bleeding urgency.  Her appetite is good and her weight has been stable.  She is not having any side effects with adalimumab. ?She says she is scheduled for bone density in August 2023.  She remains very active.  She walks 2 to 4 miles per week and she does Zumba twice a week and bar/fitness exercise once a week. ?She had normal LFTs last month.  She wonders if she should have any other test done.  Her last QuantiFERON-TB gold plus was in February 2020. ? ?Current Medications: ?Outpatient Encounter Medications as of 10/28/2021  ?Medication Sig  ? alendronate (FOSAMAX) 70 MG tablet Take 70 mg by mouth once a week. Take with a full glass of water on an empty stomach.  ? Biotin w/ Vitamins C & E (HAIR/SKIN/NAILS PO) Take 5,000 mcg by mouth 2 (two) times daily.  ? Calcium Carb-Cholecalciferol (CALCIUM + VITAMIN D3 PO) Take by mouth 2 (two) times daily. Calicum 283 mg, Vitamin D3 500 IU  ? cetirizine (ZYRTEC ALLERGY)  10 MG tablet Take 1 tablet (10 mg total) by mouth daily.  ? Cholecalciferol (VITAMIN D3 PO) Take 1,000 Units by mouth 2 (two) times daily. This is a gel capsule.  ? HUMIRA PEN 40 MG/0.4ML PNKT Inject 40 mg into the skin every 14 (fourteen) days.  ? Krill Oil 1000 MG CAPS Take 1 capsule (1,000 mg total) by mouth 2 (two) times daily. (Patient taking differently: Take 500 mg by mouth 2 (two) times daily.)  ? mesalamine (APRISO) 0.375 g 24 hr capsule TAKE 4 CAPSULES BY MOUTH EVERY DAY  ? OVER THE COUNTER MEDICATION [place] - Collagen Peptides (10 grams of collagen peptides) - patient uses 1 scoop in a drink or her food daily.  ? rosuvastatin (CRESTOR) 10 MG tablet Take 1 tablet (10 mg total) by mouth daily.  ? ?No facility-administered encounter medications on file as of 10/28/2021.  ? ? ? ?Objective: ?Blood pressure 112/72, pulse 69, temperature 99.1 ?F (37.3 ?C), temperature source Oral, height 5' 4"  (1.626 m), weight 121 lb 8 oz (55.1 kg). ?Patient is alert and in no acute distress. ?Conjunctiva is pink. Sclera is nonicteric ?Oropharyngeal mucosa is normal. ?No neck masses or thyromegaly noted. ?Cardiac exam with regular rhythm normal S1 and S2. No murmur or gallop noted. ?Lungs are clear to auscultation. ?Abdomen is flat soft and nontender with organomegaly or masses. ?No LE edema or clubbing noted. ? ?Labs/studies Results: ? ? ?CBC Latest Ref  Rng & Units 04/29/2021 10/16/2020 03/18/2020  ?WBC 3.8 - 10.8 Thousand/uL 4.3 4.2 6.0  ?Hemoglobin 11.7 - 15.5 g/dL 12.6 12.2 13.0  ?Hematocrit 35.0 - 45.0 % 39.2 37.5 40.3  ?Platelets 140 - 400 Thousand/uL 281 261 282  ?  ?CMP Latest Ref Rng & Units 10/02/2021 04/29/2021 10/16/2020  ?Glucose 65 - 99 mg/dL - 87 55(L)  ?BUN 7 - 25 mg/dL - 15 17  ?Creatinine 0.50 - 1.03 mg/dL - 0.63 0.67  ?Sodium 135 - 146 mmol/L - 140 139  ?Potassium 3.5 - 5.3 mmol/L - 4.4 4.2  ?Chloride 98 - 110 mmol/L - 105 106  ?CO2 20 - 32 mmol/L - 30 27  ?Calcium 8.6 - 10.4 mg/dL - 9.7 9.4  ?Total Protein 6.0 - 8.5  g/dL 7.6 7.8 7.3  ?Total Bilirubin 0.0 - 1.2 mg/dL 0.4 0.3 0.4  ?Alkaline Phos 44 - 121 IU/L 55 - -  ?AST 0 - 40 IU/L 24 17 16   ?ALT 0 - 32 IU/L 17 12 16   ?  ?Hepatic Function Latest Ref Rng & Units 10/02/2021 04/29/2021 10/16/2020  ?Total Protein 6.0 - 8.5 g/dL 7.6 7.8 7.3  ?Albumin 3.8 - 4.9 g/dL 4.7 - -  ?AST 0 - 40 IU/L 24 17 16   ?ALT 0 - 32 IU/L 17 12 16   ?Alk Phosphatase 44 - 121 IU/L 55 - -  ?Total Bilirubin 0.0 - 1.2 mg/dL 0.4 0.3 0.4  ?Bilirubin, Direct 0.00 - 0.40 mg/dL 0.10 - -  ?  ?Lab Results  ?Component Value Date  ? CRP 0.9 01/02/2020  ?  ?LFTs from 10/02/2021 ? ?Bilirubin 0.4, AP 55, AST 44, ALT 17, total protein 7.6 and albumin 4.7. ? ?Assessment: ? ?#1.  Pan ulcerative colitis.  Disease duration 13 years.  It is interesting to note that she had distal disease to begin with in 2010 but colonoscopy in December 2020 revealed disease all the way to the ascending colon with sparing of cecum. ?She appears to be in remission.  Fecal calprotectin 1 year was 51.  I do not feel there is any need to repeat this test.  It would be reasonable to recheck or Defrang TB Gold plus.  However she is low risk to acquired this infection. ? ?Plan: ? ?QuantiFERON-TB gold plus. ?Patient will continue Apriso at a dose of 150 0 mg by mouth daily. ?Continue Humira/adalimumab at a dose of 40 mg SQ every 2 weeks. ?Patient will call if she has diarrhea or rectal bleeding. ?She will try to bring results of bone density study which is scheduled in August 2023. ?Office visit in 6 months with Dr. Jenetta Downer. ? ? ? ? ? ?

## 2021-10-29 DIAGNOSIS — K5150 Left sided colitis without complications: Secondary | ICD-10-CM | POA: Diagnosis not present

## 2021-11-02 LAB — QUANTIFERON-TB GOLD PLUS
Mitogen-NIL: 9.98 IU/mL
NIL: 0.01 IU/mL
QuantiFERON-TB Gold Plus: NEGATIVE
TB1-NIL: 0 IU/mL
TB2-NIL: 0 IU/mL

## 2021-11-04 ENCOUNTER — Telehealth (INDEPENDENT_AMBULATORY_CARE_PROVIDER_SITE_OTHER): Payer: Self-pay

## 2021-11-07 ENCOUNTER — Ambulatory Visit (INDEPENDENT_AMBULATORY_CARE_PROVIDER_SITE_OTHER): Payer: 59 | Admitting: Family Medicine

## 2021-11-07 ENCOUNTER — Other Ambulatory Visit: Payer: Self-pay

## 2021-11-07 ENCOUNTER — Encounter: Payer: Self-pay | Admitting: Family Medicine

## 2021-11-07 VITALS — BP 110/72 | HR 78 | Temp 97.5°F | Wt 119.6 lb

## 2021-11-07 DIAGNOSIS — K529 Noninfective gastroenteritis and colitis, unspecified: Secondary | ICD-10-CM | POA: Diagnosis not present

## 2021-11-07 DIAGNOSIS — N3001 Acute cystitis with hematuria: Secondary | ICD-10-CM

## 2021-11-07 DIAGNOSIS — R300 Dysuria: Secondary | ICD-10-CM | POA: Diagnosis not present

## 2021-11-07 LAB — POCT URINALYSIS DIPSTICK
Protein, UA: POSITIVE — AB
Spec Grav, UA: 1.01 (ref 1.010–1.025)
pH, UA: 6.5 (ref 5.0–8.0)

## 2021-11-07 MED ORDER — CEPHALEXIN 500 MG PO CAPS: 500.0000 mg | ORAL_CAPSULE | Freq: Two times a day (BID) | ORAL | 0 refills | Status: DC

## 2021-11-07 MED ORDER — ONDANSETRON HCL 4 MG PO TABS: 4.0000 mg | ORAL_TABLET | Freq: Three times a day (TID) | ORAL | 0 refills | Status: DC | PRN

## 2021-11-07 NOTE — Assessment & Plan Note (Signed)
Patient experiencing diarrhea and nausea.  She states that she does not feel like this is a flare of her ulcerative colitis.  Zofran as needed.  Supportive care. ?

## 2021-11-07 NOTE — Assessment & Plan Note (Signed)
Urinalysis consistent with UTI.  Sending culture.  Placing on Keflex. ?

## 2021-11-07 NOTE — Patient Instructions (Signed)
Medications as prescribed. ? ?Follow-up with Dr. Wolfgang Phoenix as previously recommended. ? ?Call with concerns.   ? ?Take care ? ?Dr. Lacinda Axon  ?

## 2021-11-07 NOTE — Progress Notes (Signed)
? ?Subjective:  ?Patient ID: Yvonne [name], female    DOB: [birth year]  Age: 61 y.o. MRN: [id number] ? ?CC: ?Chief Complaint  ?Patient presents with  ? Urinary Tract Infection  ?  Burning with urination, urgency,  cloudy, going on for about 3 days, did see blood twice over the past few days. Pt woke up this morning with diarrhea (pt whole family had stomach bug last week). Nausea and cold sweats this morning.   ? ? ?HPI: ? ?61 year old female presents for evaluation of the above. ? ?Patient reports that for the past 3 days she has had urinary symptoms.  She states that she has had cloudy urine, hematuria, urinary pressure as well as urinary frequency and dysuria.  Patient believes that she has UTI.  No fever.  No relieving factors.  Patient also notes that she has developed diarrhea as of this morning.  She states that her husband and some of her other family members have recently had a stomach bug.  She denies any bloody stool.  She states that she has had some associated nausea.  No vomiting. ? ?Patient Active Problem List  ? Diagnosis Date Noted  ? Acute cystitis with hematuria 11/07/2021  ? Gastroenteritis 11/07/2021  ? Seasonal allergic rhinitis due to pollen 12/19/2020  ? Osteoporosis 05/27/2020  ? Ulcerative colitis ([place]) 07/24/2019  ? Hemorrhoids, external 07/04/2019  ? Hyperlipidemia 05/09/2014  ? Abnormal transaminases 03/17/2012  ? ULCERATIVE COLITIS-LEFT SIDE 04/16/2009  ? ? ?Social Hx   ?Social History  ? ?Socioeconomic History  ? Marital status: Married  ?  Spouse name: Not on file  ? Number of children: 2  ? Years of education: Not on file  ? Highest education level: Not on file  ?Occupational History  ? Occupation: cleaning bussiness-self employed  ?  Employer: NOT EMPLOYED  ?Tobacco Use  ? Smoking status: Never  ?  Passive exposure: Never  ? Smokeless tobacco: Never  ?Vaping Use  ? Vaping Use: Never used  ?Substance and Sexual Activity  ? Alcohol use: No  ?  Alcohol/week: 0.0 standard drinks  ? Drug  use: No  ? Sexual activity: Not on file  ?Other Topics Concern  ? Not on file  ?Social History Narrative  ? Not on file  ? ?Social Determinants of Health  ? ?Financial Resource Strain: Not on file  ?Food Insecurity: Not on file  ?Transportation Needs: Not on file  ?Physical Activity: Not on file  ?Stress: Not on file  ?Social Connections: Not on file  ? ? ?Review of Systems ?Per HPI ? ?Objective:  ?BP 110/72   Pulse 78   Temp (!) 97.5 ?F (36.4 ?C)   Wt 119 lb 9.6 oz (54.3 kg)   SpO2 99%   BMI 20.53 kg/m?  ? ? ?  11/07/2021  ?  1:50 PM 10/28/2021  ? 11:39 AM 07/24/2021  ?  9:46 AM  ?BP/Weight  ?Systolic BP 268 341 962  ?Diastolic BP 72 72 76  ?Wt. (Lbs) 119.6 121.5 119  ?BMI 20.53 kg/m2 20.86 kg/m2 20.43 kg/m2  ? ? ?Physical Exam ?Vitals and nursing note reviewed.  ?Constitutional:   ?   General: She is not in acute distress. ?   Appearance: Normal appearance. She is not ill-appearing.  ?HENT:  ?   Head: Normocephalic and atraumatic.  ?Eyes:  ?   General:     ?   Right eye: No discharge.     ?   Left eye: No discharge.  ?  Conjunctiva/sclera: Conjunctivae normal.  ?Cardiovascular:  ?   Rate and Rhythm: Normal rate and regular rhythm.  ?Pulmonary:  ?   Effort: Pulmonary effort is normal.  ?   Breath sounds: Normal breath sounds. No wheezing, rhonchi or rales.  ?Abdominal:  ?   General: There is no distension.  ?   Palpations: Abdomen is soft.  ?   Tenderness: There is no abdominal tenderness.  ?Neurological:  ?   Mental Status: She is alert.  ?Psychiatric:     ?   Mood and Affect: Mood normal.     ?   Behavior: Behavior normal.  ? ? ?Lab Results  ?Component Value Date  ? WBC 4.3 04/29/2021  ? HGB 12.6 04/29/2021  ? HCT 39.2 04/29/2021  ? PLT 281 04/29/2021  ? GLUCOSE 87 04/29/2021  ? CHOL 189 10/02/2021  ? TRIG 55 10/02/2021  ? HDL 73 10/02/2021  ? LDLCALC 106 (H) 10/02/2021  ? ALT 17 10/02/2021  ? AST 24 10/02/2021  ? NA 140 04/29/2021  ? K 4.4 04/29/2021  ? CL 105 04/29/2021  ? CREATININE 0.63 04/29/2021  ? BUN  15 04/29/2021  ? CO2 30 04/29/2021  ? TSH 1.19 12/29/2018  ? ? ? ?Assessment & Plan:  ? ?Problem List Items Addressed This Visit   ? ?  ? Digestive  ? Gastroenteritis  ?  Patient experiencing diarrhea and nausea.  She states that she does not feel like this is a flare of her ulcerative colitis.  Zofran as needed.  Supportive care. ?  ?  ?  ? Genitourinary  ? Acute cystitis with hematuria - Primary  ?  Urinalysis consistent with UTI.  Sending culture.  Placing on Keflex. ?  ?  ? Relevant Orders  ? POCT Urinalysis Dipstick (Completed)  ? Urine Culture  ? ? ?Meds ordered this encounter  ?Medications  ? ondansetron (ZOFRAN) 4 MG tablet  ?  Sig: Take 1 tablet (4 mg total) by mouth every 8 (eight) hours as needed for nausea or vomiting.  ?  Dispense:  20 tablet  ?  Refill:  0  ? cephALEXin (KEFLEX) 500 MG capsule  ?  Sig: Take 1 capsule (500 mg total) by mouth 2 (two) times daily.  ?  Dispense:  14 capsule  ?  Refill:  0  ? ?Thersa Salt DO ?[place] ? ?

## 2021-11-11 LAB — URINE CULTURE

## 2021-11-11 LAB — SPECIMEN STATUS REPORT

## 2021-12-12 ENCOUNTER — Other Ambulatory Visit: Payer: Self-pay

## 2021-12-12 DIAGNOSIS — J301 Allergic rhinitis due to pollen: Secondary | ICD-10-CM

## 2021-12-12 MED ORDER — CETIRIZINE HCL 10 MG PO TABS: 10.0000 mg | ORAL_TABLET | Freq: Every day | ORAL | 1 refills | Status: DC

## 2021-12-16 ENCOUNTER — Encounter: Payer: Self-pay | Admitting: Family Medicine

## 2021-12-16 MED ORDER — ROSUVASTATIN CALCIUM 20 MG PO TABS: ORAL_TABLET | ORAL | 1 refills | Status: DC

## 2021-12-22 DIAGNOSIS — Z8639 Personal history of other endocrine, nutritional and metabolic disease: Secondary | ICD-10-CM | POA: Diagnosis not present

## 2021-12-22 DIAGNOSIS — M810 Age-related osteoporosis without current pathological fracture: Secondary | ICD-10-CM | POA: Diagnosis not present

## 2021-12-29 DIAGNOSIS — M810 Age-related osteoporosis without current pathological fracture: Secondary | ICD-10-CM | POA: Diagnosis not present

## 2021-12-29 DIAGNOSIS — K5190 Ulcerative colitis, unspecified, without complications: Secondary | ICD-10-CM | POA: Diagnosis not present

## 2021-12-29 DIAGNOSIS — Z8639 Personal history of other endocrine, nutritional and metabolic disease: Secondary | ICD-10-CM | POA: Diagnosis not present

## 2021-12-29 DIAGNOSIS — D72819 Decreased white blood cell count, unspecified: Secondary | ICD-10-CM | POA: Diagnosis not present

## 2021-12-29 DIAGNOSIS — Z6820 Body mass index (BMI) 20.0-20.9, adult: Secondary | ICD-10-CM | POA: Diagnosis not present

## 2022-02-11 ENCOUNTER — Encounter (INDEPENDENT_AMBULATORY_CARE_PROVIDER_SITE_OTHER): Payer: Self-pay | Admitting: Gastroenterology

## 2022-03-30 ENCOUNTER — Telehealth: Payer: Self-pay | Admitting: Family Medicine

## 2022-03-30 NOTE — Telephone Encounter (Signed)
Patient is requesting labs for her follow up in September

## 2022-03-30 NOTE — Telephone Encounter (Signed)
Last labs 09/2021: Lipid, Liver

## 2022-03-30 NOTE — Telephone Encounter (Signed)
1.  Nurses-please talk with patient.  I believe her rheumatologist does blood work on her such as kidney function and CBC. If that is so therefore the only test we need to do is lipid liver. Also if her specialist does not do vitamin D I would recommend a vitamin D level thank you Hyperlipidemia, vitamin D deficiency

## 2022-03-31 ENCOUNTER — Other Ambulatory Visit: Payer: Self-pay | Admitting: Family Medicine

## 2022-03-31 NOTE — Telephone Encounter (Signed)
Patient states she doesn't not have labs this time with endocrinologist - just bone density and appt in August - they do her labs once a year and it will be due at her next visit

## 2022-03-31 NOTE — Telephone Encounter (Signed)
Lipid, liver, CMP, vitamin D, CBC Vitamin D deficiency hyperlipidemia high risk medications

## 2022-04-01 ENCOUNTER — Other Ambulatory Visit: Payer: Self-pay

## 2022-04-01 DIAGNOSIS — Z79899 Other long term (current) drug therapy: Secondary | ICD-10-CM

## 2022-04-01 DIAGNOSIS — E559 Vitamin D deficiency, unspecified: Secondary | ICD-10-CM

## 2022-04-01 DIAGNOSIS — E7849 Other hyperlipidemia: Secondary | ICD-10-CM

## 2022-04-01 NOTE — Telephone Encounter (Signed)
Called and left message to inform pt lab orders are in place

## 2022-04-10 DIAGNOSIS — E559 Vitamin D deficiency, unspecified: Secondary | ICD-10-CM | POA: Diagnosis not present

## 2022-04-10 DIAGNOSIS — E7849 Other hyperlipidemia: Secondary | ICD-10-CM | POA: Diagnosis not present

## 2022-04-10 DIAGNOSIS — Z79899 Other long term (current) drug therapy: Secondary | ICD-10-CM | POA: Diagnosis not present

## 2022-04-11 LAB — CBC WITH DIFFERENTIAL/PLATELET
Basophils Absolute: 0 10*3/uL (ref 0.0–0.2)
Basos: 1 %
EOS (ABSOLUTE): 0 10*3/uL (ref 0.0–0.4)
Eos: 0 %
Hematocrit: 39.7 % (ref 34.0–46.6)
Hemoglobin: 12.7 g/dL (ref 11.1–15.9)
Immature Grans (Abs): 0 10*3/uL (ref 0.0–0.1)
Immature Granulocytes: 0 %
Lymphocytes Absolute: 1.6 10*3/uL (ref 0.7–3.1)
Lymphs: 39 %
MCH: 29.2 pg (ref 26.6–33.0)
MCHC: 32 g/dL (ref 31.5–35.7)
MCV: 91 fL (ref 79–97)
Monocytes Absolute: 0.3 10*3/uL (ref 0.1–0.9)
Monocytes: 8 %
Neutrophils Absolute: 2.2 10*3/uL (ref 1.4–7.0)
Neutrophils: 52 %
Platelets: 246 10*3/uL (ref 150–450)
RBC: 4.35 x10E6/uL (ref 3.77–5.28)
RDW: 12.7 % (ref 11.7–15.4)
WBC: 4.2 10*3/uL (ref 3.4–10.8)

## 2022-04-11 LAB — COMPREHENSIVE METABOLIC PANEL
ALT: 18 IU/L (ref 0–32)
AST: 21 IU/L (ref 0–40)
Albumin/Globulin Ratio: 1.7 (ref 1.2–2.2)
Albumin: 4.8 g/dL (ref 3.8–4.9)
Alkaline Phosphatase: 57 IU/L (ref 44–121)
BUN/Creatinine Ratio: 25 — ABNORMAL HIGH (ref 9–23)
BUN: 19 mg/dL (ref 6–24)
Bilirubin Total: 0.4 mg/dL (ref 0.0–1.2)
CO2: 23 mmol/L (ref 20–29)
Calcium: 9.9 mg/dL (ref 8.7–10.2)
Chloride: 101 mmol/L (ref 96–106)
Creatinine, Ser: 0.75 mg/dL (ref 0.57–1.00)
Globulin, Total: 2.9 g/dL (ref 1.5–4.5)
Glucose: 81 mg/dL (ref 70–99)
Potassium: 4.8 mmol/L (ref 3.5–5.2)
Sodium: 140 mmol/L (ref 134–144)
Total Protein: 7.7 g/dL (ref 6.0–8.5)
eGFR: 92 mL/min/{1.73_m2} (ref >59)

## 2022-04-11 LAB — VITAMIN D 25 HYDROXY (VIT D DEFICIENCY, FRACTURES): Vit D, 25-Hydroxy: 59.4 ng/mL (ref 30.0–100.0)

## 2022-04-11 LAB — LIPID PANEL
Chol/HDL Ratio: 2.3 ratio (ref 0.0–4.4)
Cholesterol, Total: 192 mg/dL (ref 100–199)
HDL: 83 mg/dL (ref >39)
LDL Chol Calc (NIH): 98 mg/dL (ref 0–99)
Triglycerides: 57 mg/dL (ref 0–149)
VLDL Cholesterol Cal: 11 mg/dL (ref 5–40)

## 2022-04-11 LAB — HEPATIC FUNCTION PANEL: Bilirubin, Direct: 0.11 mg/dL (ref 0.00–0.40)

## 2022-04-16 ENCOUNTER — Telehealth (INDEPENDENT_AMBULATORY_CARE_PROVIDER_SITE_OTHER): Payer: Self-pay | Admitting: Gastroenterology

## 2022-04-16 ENCOUNTER — Ambulatory Visit (INDEPENDENT_AMBULATORY_CARE_PROVIDER_SITE_OTHER): Payer: 59 | Admitting: Gastroenterology

## 2022-04-16 ENCOUNTER — Encounter (INDEPENDENT_AMBULATORY_CARE_PROVIDER_SITE_OTHER): Payer: Self-pay | Admitting: Gastroenterology

## 2022-04-16 VITALS — BP 123/73 | HR 71 | Temp 98.1°F | Ht 64.0 in | Wt 119.8 lb

## 2022-04-16 DIAGNOSIS — Z79620 Long term (current) use of immunosuppressive biologic: Secondary | ICD-10-CM | POA: Diagnosis not present

## 2022-04-16 DIAGNOSIS — K5100 Ulcerative (chronic) pancolitis without complications: Secondary | ICD-10-CM | POA: Diagnosis not present

## 2022-04-16 NOTE — Progress Notes (Signed)
Referring Provider: Kathyrn Drown, MD Primary Care Physician:  Kathyrn Drown, MD Primary GI Physician: Previously Rehman   Chief Complaint  Patient presents with   Follow-up    Patient here today for a follow up on UC. Patient denies any current issues.   HPI:   Yvonne [name] is a 61 y.o. female with past medical history of anemia, HLD, osteoporosis, UC.   Patient presenting today for follow up of UC.  Patient with hx of UC, diagnosed in July 2010 with distal disease, originally on mesalamine and 6-MP but developed hepatitis and 6-MP was discontinued in oct 2014. Disease was not controlled with mesalamine, started on Humira June 2021, remains in this as well as oral mesalamine.   Last seen march 2023. At that time, doing well, having 1 formed stool every day to every other day. No abdominal pain, rectal bleeding or cramping. Scheduled for bone density in Aug 2023. Fecal calprotectin in March 2022 was 51. She had repeat TB testing, continued on Apriso 143m daily, humira 449mSQ q2 weeks (initiated June 2021)   Labs on 04/10/22 with normal LFTs, renal function, hgb 12.7, WBC 4.2, Vit D 59.4, lipid panel WNL  Last Hep B serologies in December 2020 with non reactive Hep B surface Ag  Present:  Patient states she is doing well, she reports that she has a BM every day to every other day with formed stools. Denies any blood in stools or mucus. She denies changes in appetite. Patient denies melena, nausea, vomiting, diarrhea, constipation, dysphagia, odyonophagia, early satiety or weight loss. Feels that she is doing very well on her current regimen. Having Dexa scan with OBGYN next month.   Extraintestinal Manifestations: Skin: none Joints: none Eyes: none  Last Colonoscopy:08/03/19 - Moderately active (Mayo Score 2) pancolitis ulcerative colitis, worsened since the last examination. Biopsied (mildly active chronic colitis in transverse, descending and rectum) - The terminal ileum is  normal. Comment: UC has progressed from left sided disease to pancoloitis with sparing of cecum. There is erythema to ascending colon.  Recommendations:    Past Medical History:  Diagnosis Date   Anemia    HLD (hyperlipidemia)    Osteoporosis    Ulcerative colitis     Past Surgical History:  Procedure Laterality Date   BIOPSY  08/03/2019   Procedure: BIOPSY;  Surgeon: ReRogene HoustonMD;  [facility];  Service: Endoscopy;;   COLONOSCOPY N/A 08/03/2019   Procedure: COLONOSCOPY;  Surgeon: ReRogene HoustonMD;  [facility];  Service: Endoscopy;  Laterality: N/A;  930   PARTIAL HYSTERECTOMY      Current Outpatient Medications  Medication Sig Dispense Refill   alendronate (FOSAMAX) 70 MG tablet Take 70 mg by mouth once a week. Take with a full glass of water on an empty stomach.     Biotin w/ Vitamins C & E (HAIR/SKIN/NAILS PO) Take 5,000 mcg by mouth 2 (two) times daily.     Calcium Carb-Cholecalciferol (CALCIUM + VITAMIN D3 PO) Take by mouth 2 (two) times daily. Calicum 40409g, Vitamin D3 500 IU     Cholecalciferol (VITAMIN D3 PO) Take 1,000 Units by mouth 2 (two) times daily. This is a gel capsule.     HUMIRA PEN 40 MG/0.4ML PNKT Inject 40 mg into the skin every 14 (fourteen) days. 2 each 11   Krill Oil 1000 MG CAPS Take 1 capsule (1,000 mg total) by mouth 2 (two) times daily. (Patient taking differently: Take  500 mg by mouth 2 (two) times daily.)     mesalamine (APRISO) 0.375 g 24 hr capsule TAKE 4 CAPSULES BY MOUTH EVERY DAY 120 capsule 11   ondansetron (ZOFRAN) 4 MG tablet Take 1 tablet (4 mg total) by mouth every 8 (eight) hours as needed for nausea or vomiting. 20 tablet 0   OVER THE COUNTER MEDICATION [place] - Collagen Peptides (10 grams of collagen peptides) - patient uses 1 scoop in a drink or her food daily.     rosuvastatin (CRESTOR) 20 MG tablet Take 1/2 tablet po once daily 45 tablet 1   cetirizine (ZYRTEC ALLERGY) 10 MG tablet Take 1 tablet  (10 mg total) by mouth daily. (Patient not taking: Reported on 04/16/2022) 90 tablet 1   No current facility-administered medications for this visit.    Allergies as of 04/16/2022   (No Known Allergies)    Family History  Problem Relation Age of Onset   Colon cancer Neg Hx    Diabetes Neg Hx    Heart disease Neg Hx     Social History   Socioeconomic History   Marital status: Married    Spouse name: Not on file   Number of children: 2   Years of education: Not on file   Highest education level: Not on file  Occupational History   Occupation: cleaning bussiness-self employed    Employer: NOT EMPLOYED  Tobacco Use   Smoking status: Never    Passive exposure: Never   Smokeless tobacco: Never  Vaping Use   Vaping Use: Never used  Substance and Sexual Activity   Alcohol use: No    Alcohol/week: 0.0 standard drinks of alcohol   Drug use: No   Sexual activity: Not on file  Other Topics Concern   Not on file  Social History Narrative   Not on file   Social Determinants of Health   Financial Resource Strain: Not on file  Food Insecurity: Not on file  Transportation Needs: Not on file  Physical Activity: Not on file  Stress: Not on file  Social Connections: Not on file   Review of systems General: negative for malaise, night sweats, fever, chills, weight loss Neck: Negative for lumps, goiter, pain and significant neck swelling Resp: Negative for cough, wheezing, dyspnea at rest CV: Negative for chest pain, leg swelling, palpitations, orthopnea GI: denies melena, hematochezia, nausea, vomiting, diarrhea, constipation, dysphagia, odyonophagia, early satiety or unintentional weight loss.  MSK: Negative for joint pain or swelling, back pain, and muscle pain. Derm: Negative for itching or rash Psych: Denies depression, anxiety, memory loss, confusion. No homicidal or suicidal ideation.  Heme: Negative for prolonged bleeding, bruising easily, and swollen nodes. Endocrine:  Negative for cold or heat intolerance, polyuria, polydipsia and goiter. Neuro: negative for tremor, gait imbalance, syncope and seizures. The remainder of the review of systems is noncontributory.  Physical Exam: BP 123/73 (BP Location: Right Arm, Patient Position: Sitting, Cuff Size: Small)   Pulse 71   Temp 98.1 F (36.7 C) (Oral)   Ht 5' 4"  (1.626 m)   Wt 119 lb 12.8 oz (54.3 kg)   BMI 20.56 kg/m  General:   Alert and oriented. No distress noted. Pleasant and cooperative.  Head:  Normocephalic and atraumatic. Eyes:  Conjuctiva clear without scleral icterus. Mouth:  Oral mucosa pink and moist. Good dentition. No lesions. Heart: Normal rate and rhythm, s1 and s2 heart sounds present.  Lungs: Clear lung sounds in all lobes. Respirations equal and unlabored. Abdomen:  +  BS, soft, non-tender and non-distended. No rebound or guarding. No HSM or masses noted. Derm: No palmar erythema or jaundice Msk:  Symmetrical without gross deformities. Normal posture. Extremities:  Without edema. Neurologic:  Alert and  oriented x4 Psych:  Alert and cooperative. Normal mood and affect.  Invalid input(s): "6 MONTHS"   ASSESSMENT: Yvonne [name] is a 61 y.o. female presenting today for follow up of UC.  Patient maintained on Humira 22m q2w since June 2021 as well as continued on Apriso 1.5g per day. She is doing well on current regimen. Having 1 solid stool every day to every other day. Denies rectal bleeding, melena, abdominal pain, changes in appetite or mucus in stools. Feels that current regimen is working well for her. Notably last colonoscopy was in December 2020 with active disease, prior to initiation of Humira. I did discuss with the patient recommendations to update Colonoscopy to evaluate for endoscopic remission given timing of her last evaluation. She states she was told repeat could be done in 5 years at her last OV. Advised I would discuss with Dr. CJenetta Downerto confirm, but the  recommendation is usually to re evaluate for endoscopic remission 6 months after change in treatment for IBD.  She is up to date on routine labs with normal LFTs, Renal function, Tb Quant, Vitamin D and having DEXA scan with OBGYN next month. Will update Hep B testing today as well, last was done in Dec 2020. She is up to date on other routine labs.    PLAN:  Will likely plan for colonoscopy for evaluation of endoscopic remission 2. Continue Humira 434mSQ q2w 3. Continue Apriso 1.5g  4. Hep B Ag 5. TB Quant due march 2024  All questions were answered, patient verbalized understanding and is in agreement with plan as outlined above.    Follow Up: 6 months   Yvonne Bastone L. CaAlver SorrowMSN, APRN, AGNP-C Adult-Gerontology Nurse Practitioner [facility]

## 2022-04-16 NOTE — Telephone Encounter (Signed)
I called and let the patient know that she does need the Tcs, and someone will be in contact with her.

## 2022-04-16 NOTE — Patient Instructions (Signed)
It was nice to meet you and I am glad you are doing well! We will update Hepatitis B screening today as this has not been done in 3 years Will discuss with Dr. Jenetta Downer updating your colonoscopy as we typically like to do this 1 year after change in therapy. Will continue with Humira and Apriso at current doses  Follow up 6 months

## 2022-04-21 ENCOUNTER — Ambulatory Visit (INDEPENDENT_AMBULATORY_CARE_PROVIDER_SITE_OTHER): Payer: 59 | Admitting: Gastroenterology

## 2022-04-24 DIAGNOSIS — Z79620 Long term (current) use of immunosuppressive biologic: Secondary | ICD-10-CM | POA: Diagnosis not present

## 2022-04-25 LAB — HEPATITIS B SURFACE ANTIGEN: Hepatitis B Surface Ag: NEGATIVE

## 2022-04-27 ENCOUNTER — Telehealth: Payer: Self-pay | Admitting: Gastroenterology

## 2022-04-27 NOTE — Telephone Encounter (Signed)
Hep B s Ag negative on 04/24/2022

## 2022-04-29 DIAGNOSIS — Z1231 Encounter for screening mammogram for malignant neoplasm of breast: Secondary | ICD-10-CM | POA: Diagnosis not present

## 2022-04-29 DIAGNOSIS — Z01419 Encounter for gynecological examination (general) (routine) without abnormal findings: Secondary | ICD-10-CM | POA: Diagnosis not present

## 2022-04-29 DIAGNOSIS — Z13820 Encounter for screening for osteoporosis: Secondary | ICD-10-CM | POA: Diagnosis not present

## 2022-04-29 DIAGNOSIS — Z6821 Body mass index (BMI) 21.0-21.9, adult: Secondary | ICD-10-CM | POA: Diagnosis not present

## 2022-05-04 ENCOUNTER — Ambulatory Visit (INDEPENDENT_AMBULATORY_CARE_PROVIDER_SITE_OTHER): Payer: Self-pay | Admitting: Gastroenterology

## 2022-05-07 ENCOUNTER — Other Ambulatory Visit (INDEPENDENT_AMBULATORY_CARE_PROVIDER_SITE_OTHER): Payer: Self-pay

## 2022-05-07 ENCOUNTER — Telehealth (INDEPENDENT_AMBULATORY_CARE_PROVIDER_SITE_OTHER): Payer: Self-pay

## 2022-05-07 ENCOUNTER — Encounter (INDEPENDENT_AMBULATORY_CARE_PROVIDER_SITE_OTHER): Payer: Self-pay

## 2022-05-07 DIAGNOSIS — K5100 Ulcerative (chronic) pancolitis without complications: Secondary | ICD-10-CM

## 2022-05-07 MED ORDER — SUTAB 1479-225-188 MG PO TABS: 1.0000 | ORAL_TABLET | Freq: Once | ORAL | 0 refills | Status: AC

## 2022-05-07 NOTE — Telephone Encounter (Signed)
Yvonne [name], CMA  ?

## 2022-05-12 ENCOUNTER — Encounter: Payer: Self-pay | Admitting: Family Medicine

## 2022-05-12 ENCOUNTER — Ambulatory Visit (INDEPENDENT_AMBULATORY_CARE_PROVIDER_SITE_OTHER): Payer: 59 | Admitting: Family Medicine

## 2022-05-12 VITALS — BP 120/76 | Temp 97.7°F | Wt 120.4 lb

## 2022-05-12 DIAGNOSIS — Z23 Encounter for immunization: Secondary | ICD-10-CM

## 2022-05-12 DIAGNOSIS — R0981 Nasal congestion: Secondary | ICD-10-CM

## 2022-05-12 DIAGNOSIS — E7849 Other hyperlipidemia: Secondary | ICD-10-CM | POA: Diagnosis not present

## 2022-05-12 MED ORDER — ROSUVASTATIN CALCIUM 20 MG PO TABS: ORAL_TABLET | ORAL | 3 refills | Status: DC

## 2022-05-12 NOTE — Progress Notes (Signed)
   Subjective:    Patient ID: Yvonne [name], female    DOB: 07-Mar-1961, 61 y.o.   MRN: [id number]  Hyperlipidemia This is a recurrent problem. The current episode started more than 1 month ago. The problem is controlled. Recent lipid tests were reviewed and are normal. She has no history of diabetes. The current treatment provides moderate improvement of lipids. There are no compliance problems.  There are no known risk factors for coronary artery disease.   Pt arrives for follow up on cholesterol. Pt taking Crestor daily as directed. Pt states no issues at this time.   Pt would like to speak to provider about nose being stopped up all the time.  Relates a lot of head congestion on the left side of her nostril that is been present for weeks/months  Review of Systems     Objective:   Physical Exam General-in no acute distress Eyes-no discharge Lungs-respiratory rate normal, CTA CV-no murmurs,RRR Extremities skin warm dry no edema Neuro grossly normal Behavior normal, alert  Probable deviated septum on physical exam      Assessment & Plan:  1. Other hyperlipidemia Continue cholesterol medicine healthy diet Follow-up in 1 year 2. Nasal congestion Does appear to have a possible deviated septum on the left side would be beneficial for the patient to go ahead and be seen by ENT  3. Need for vaccination Flu shot today - Flu Vaccine QUAD 6+ mos PF IM (Fluarix Quad PF)

## 2022-05-12 NOTE — Progress Notes (Signed)
05/12/22- referral placed in Epic

## 2022-06-17 ENCOUNTER — Encounter (HOSPITAL_COMMUNITY): Payer: Self-pay | Admitting: Gastroenterology

## 2022-06-17 ENCOUNTER — Encounter (HOSPITAL_COMMUNITY)
Admission: RE | Admit: 2022-06-17 | Discharge: 2022-06-17 | Disposition: A | Discharge status: Home / Self Care | Payer: 59 | Source: Ambulatory Visit | Attending: Gastroenterology | Admitting: Gastroenterology

## 2022-06-19 ENCOUNTER — Ambulatory Visit (HOSPITAL_COMMUNITY): Payer: 59 | Admitting: Anesthesiology

## 2022-06-19 ENCOUNTER — Encounter (HOSPITAL_COMMUNITY)
Admission: RE | Disposition: A | Discharge status: Home / Self Care | Payer: Self-pay | Source: Home / Self Care | Attending: Gastroenterology

## 2022-06-19 ENCOUNTER — Ambulatory Visit (HOSPITAL_COMMUNITY)
Admission: RE | Admit: 2022-06-19 | Discharge: 2022-06-19 | Disposition: A | Discharge status: Home / Self Care | Payer: 59 | Attending: Gastroenterology | Admitting: Gastroenterology

## 2022-06-19 ENCOUNTER — Ambulatory Visit (HOSPITAL_BASED_OUTPATIENT_CLINIC_OR_DEPARTMENT_OTHER): Payer: 59 | Admitting: Anesthesiology

## 2022-06-19 ENCOUNTER — Other Ambulatory Visit: Payer: Self-pay

## 2022-06-19 DIAGNOSIS — K648 Other hemorrhoids: Secondary | ICD-10-CM

## 2022-06-19 DIAGNOSIS — Z09 Encounter for follow-up examination after completed treatment for conditions other than malignant neoplasm: Secondary | ICD-10-CM | POA: Diagnosis not present

## 2022-06-19 DIAGNOSIS — D649 Anemia, unspecified: Secondary | ICD-10-CM | POA: Diagnosis not present

## 2022-06-19 DIAGNOSIS — K5100 Ulcerative (chronic) pancolitis without complications: Secondary | ICD-10-CM | POA: Diagnosis not present

## 2022-06-19 DIAGNOSIS — K529 Noninfective gastroenteritis and colitis, unspecified: Secondary | ICD-10-CM | POA: Diagnosis not present

## 2022-06-19 DIAGNOSIS — K279 Peptic ulcer, site unspecified, unspecified as acute or chronic, without hemorrhage or perforation: Secondary | ICD-10-CM | POA: Diagnosis not present

## 2022-06-19 DIAGNOSIS — K5150 Left sided colitis without complications: Secondary | ICD-10-CM

## 2022-06-19 HISTORY — PX: BIOPSY: SHX5522

## 2022-06-19 HISTORY — PX: COLONOSCOPY WITH PROPOFOL: SHX5780

## 2022-06-19 LAB — HM COLONOSCOPY

## 2022-06-19 SURGERY — COLONOSCOPY WITH PROPOFOL
Anesthesia: General

## 2022-06-19 MED ORDER — PROPOFOL 10 MG/ML IV BOLUS
INTRAVENOUS | Status: DC | PRN
  Administered 2022-06-19: 75 mg via INTRAVENOUS

## 2022-06-19 MED ORDER — PROPOFOL 500 MG/50ML IV EMUL
INTRAVENOUS | Status: DC | PRN
  Administered 2022-06-19: 150 ug/kg/min via INTRAVENOUS

## 2022-06-19 MED ORDER — LIDOCAINE HCL 1 % IJ SOLN
INTRAMUSCULAR | Status: DC | PRN
  Administered 2022-06-19: 50 mg via INTRADERMAL

## 2022-06-19 MED ORDER — LACTATED RINGERS IV SOLN: INTRAVENOUS | Status: DC

## 2022-06-19 NOTE — Op Note (Signed)
[facility] Patient Name: Yvonne [name] Procedure Date: 06/19/2022 11:26 AM MRN: [id number] Date of Birth: 21-Jan-1961 Attending MD: Maylon Peppers , , [id number] CSN: [id number] Age: 61 Admit Type: Outpatient Procedure:                Colonoscopy Indications:              Follow-up of chronic ulcerative pancolitis Providers:                Maylon Peppers, Caprice Kluver, Raphael Gibney,                            Technician Referring MD:              Medicines:                Monitored Anesthesia Care Complications:            No immediate complications. Estimated Blood Loss:     Estimated blood loss: none. Procedure:                Pre-Anesthesia Assessment:                           - Prior to the procedure, a History and Physical                            was performed, and patient medications, allergies                            and sensitivities were reviewed. The patient's                            tolerance of previous anesthesia was reviewed.                           - The risks and benefits of the procedure and the                            sedation options and risks were discussed with the                            patient. All questions were answered and informed                            consent was obtained.                           - ASA Grade Assessment: II - A patient with mild                            systemic disease.                           After obtaining informed consent, the colonoscope                            was passed under direct vision. Throughout the  procedure, the patient's blood pressure, pulse, and                            oxygen saturations were monitored continuously. The                            [id number]-HQ190L ([id number]) scope was introduced through                            the anus and advanced to the the terminal ileum.                            The colonoscopy was performed without difficulty.                             The patient tolerated the procedure well. Scope In: 11:39:53 AM Scope Out: 12:08:33 PM Scope Withdrawal Time: 0 hours 17 minutes 35 seconds  Total Procedure Duration: 0 hours 28 minutes 40 seconds  Findings:      The perianal and digital rectal examinations were normal.      The terminal ileum appeared normal.      Inflammation was not found based on the endoscopic appearance of the       mucosa in the colon. This was graded as Mayo Score 0 (normal or inactive       disease), and when compared to the previous examination, the findings       are improved. Imaging was performed using white light and narrow band       imaging to visualize the mucosa. Biopsies were taken with a cold forceps       for histology.      Non-bleeding internal hemorrhoids were found during retroflexion. The       hemorrhoids were small. Impression:               - The examined portion of the ileum was normal.                           - Inactive (Mayo Score 0) ulcerative colitis, in                            remission, improved since the last examination.                            Biopsied.                           - Non-bleeding internal hemorrhoids. Moderate Sedation:      Per Anesthesia Care Recommendation:           - Discharge patient to home (ambulatory).                           - Resume previous diet.                           - Await pathology results.                           -  Repeat colonoscopy in 3 years for surveillance.                           - Continue Humira every 2 weeks.                           - If pathology findings are reassuring, will stop                            mesalamine. Procedure Code(s):        --- Professional ---                           [phone], Colonoscopy, flexible; with biopsy, single                            or multiple Diagnosis Code(s):        --- Professional ---                           K64.8, Other hemorrhoids                            K51.00, Ulcerative (chronic) pancolitis without                            complications CPT copyright 2022 American Medical Association. All rights reserved. The codes documented in this report are preliminary and upon coder review may  be revised to meet current compliance requirements. Maylon Peppers, MD Maylon Peppers,  06/19/2022 12:15:54 PM This report has been signed electronically. Number of Addenda: 0

## 2022-06-19 NOTE — Anesthesia Preprocedure Evaluation (Signed)
Anesthesia Evaluation  Patient identified by MRN, date of birth, ID band Patient awake    Reviewed: Allergy & Precautions, H&P , NPO status , Patient's Chart, lab work & pertinent test results  Airway Mallampati: I  TM Distance: >3 FB Neck ROM: Full    Dental  (+) Dental Advisory Given, Teeth Intact   Pulmonary neg pulmonary ROS   Pulmonary exam normal breath sounds clear to auscultation       Cardiovascular negative cardio ROS Normal cardiovascular exam Rhythm:Regular Rate:Normal     Neuro/Psych negative neurological ROS  negative psych ROS   GI/Hepatic Neg liver ROS, PUD, Bowel prep,,,Ulcerative colitis    Endo/Other  negative endocrine ROS    Renal/GU negative Renal ROS  negative genitourinary   Musculoskeletal negative musculoskeletal ROS (+)    Abdominal   Peds negative pediatric ROS (+)  Hematology  (+) Blood dyscrasia, anemia   Anesthesia Other Findings   Reproductive/Obstetrics negative OB ROS                              Anesthesia Physical Anesthesia Plan  ASA: 2  Anesthesia Plan: General   Post-op Pain Management: Minimal or no pain anticipated   Induction: Intravenous  PONV Risk Score and Plan: Propofol infusion  Airway Management Planned: Natural Airway and Nasal Cannula  Additional Equipment:   Intra-op Plan:   Post-operative Plan:   Informed Consent: I have reviewed the patients History and Physical, chart, labs and discussed the procedure including the risks, benefits and alternatives for the proposed anesthesia with the patient or authorized representative who has indicated his/her understanding and acceptance.     Dental advisory given  Plan Discussed with: CRNA and Surgeon  Anesthesia Plan Comments:          Anesthesia Quick Evaluation

## 2022-06-19 NOTE — Anesthesia Postprocedure Evaluation (Signed)
Anesthesia Post Note  Patient: Yvonne [name]  Procedure(s) Performed: COLONOSCOPY WITH PROPOFOL BIOPSY  Patient location during evaluation: Phase II Anesthesia Type: General Level of consciousness: awake and alert and oriented Pain management: pain level controlled Vital Signs Assessment: post-procedure vital signs reviewed and stable Respiratory status: spontaneous breathing, nonlabored ventilation and respiratory function stable Cardiovascular status: blood pressure returned to baseline and stable Postop Assessment: no apparent nausea or vomiting Anesthetic complications: no   No notable events documented.   Last Vitals:  Vitals:   06/19/22 1235 06/19/22 1237  BP:  124/69  Pulse:    Resp: 16   Temp: 36.7 C   SpO2: 100%     Last Pain:  Vitals:   06/19/22 1235  TempSrc: Oral  PainSc: 0-No pain                 Ivah Girardot C Lori Liew

## 2022-06-19 NOTE — Transfer of Care (Signed)
Immediate Anesthesia Transfer of Care Note  Patient: Yvonne [name]  Procedure(s) Performed: COLONOSCOPY WITH PROPOFOL BIOPSY  Patient Location: PACU  Anesthesia Type:General  Level of Consciousness: awake, alert , oriented, and patient cooperative  Airway & Oxygen Therapy: Patient Spontanous Breathing  Post-op Assessment: Report given to RN, Post -op Vital signs reviewed and stable, and Patient moving all extremities X 4  Post vital signs: Reviewed and stable  Last Vitals:  Vitals Value Taken Time  BP    Temp    Pulse    Resp    SpO2      Last Pain:  Vitals:   06/19/22 1132  TempSrc:   PainSc: 5          Complications: No notable events documented.

## 2022-06-19 NOTE — OR Nursing (Signed)
Patient to PACU for EKG. EKG shown to Dr Jenetta Downer and he spoke with patient and is okay with results. Patient to postop via W/C.

## 2022-06-19 NOTE — Discharge Instructions (Addendum)
You are being discharged to home.  Resume your previous diet.  We are waiting for your pathology results.  Your physician has recommended a repeat colonoscopy in three years for surveillance.  Continue Humira every 2 weeks. If pathology findings are reassuring, will stop mesalamine.

## 2022-06-19 NOTE — H&P (Signed)
Yvonne [name] is an 61 y.o. female.   Chief Complaint: ulcerative colitis HPI: 61 year old female with past medical history of pan ulcerative colitis, hyperlipidemia, osteoporosis , who comes to the hospital for history of ulcerative colitis.  The patient denies having any nausea, vomiting, fever, chills, hematochezia, melena, hematemesis, abdominal distention, abdominal pain, diarrhea, jaundice, pruritus or weight loss.  She has been taking Humira every 2 weeks and is on mesalamine.  Past Medical History:  Diagnosis Date   Anemia    HLD (hyperlipidemia)    Osteoporosis    Ulcerative colitis     Past Surgical History:  Procedure Laterality Date   BIOPSY  08/03/2019   Procedure: BIOPSY;  Surgeon: Rogene Houston, MD;  [facility];  Service: Endoscopy;;   COLONOSCOPY N/A 08/03/2019   Procedure: COLONOSCOPY;  Surgeon: Rogene Houston, MD;  [facility];  Service: Endoscopy;  Laterality: N/A;  21   PARTIAL HYSTERECTOMY      Family History  Problem Relation Age of Onset   Colon cancer Neg Hx    Diabetes Neg Hx    Heart disease Neg Hx    Social History:  reports that she has never smoked. She has never been exposed to tobacco smoke. She has never used smokeless tobacco. She reports that she does not drink alcohol and does not use drugs.  Allergies: No Known Allergies  Medications Prior to Admission  Medication Sig Dispense Refill   alendronate (FOSAMAX) 70 MG tablet Take 70 mg by mouth once a week. Take with a full glass of water on an empty stomach.     Biotin w/ Vitamins C & E (HAIR/SKIN/NAILS PO) Take 5,000 mcg by mouth 2 (two) times daily.     Calcium Carb-Cholecalciferol (CALCIUM + VITAMIN D3 PO) Take by mouth 2 (two) times daily. Calicum 357 mg, Vitamin D3 500 IU     cholecalciferol (VITAMIN D3) 25 MCG (1000 UNIT) tablet Take 1,000 Units by mouth 2 (two) times daily. This is a gel capsule.     HUMIRA PEN 40 MG/0.4ML PNKT Inject 40 mg into the skin  every 14 (fourteen) days. 2 each 11   Krill Oil 1000 MG CAPS Take 1 capsule (1,000 mg total) by mouth 2 (two) times daily. (Patient taking differently: Take 1,000 mg by mouth 2 (two) times daily.)     mesalamine (APRISO) 0.375 g 24 hr capsule TAKE 4 CAPSULES BY MOUTH EVERY DAY 120 capsule 11   Phenylephrine HCl (SINEX REGULAR NA) Place 1 spray into the nose daily as needed (Congestion).     Polyethyl Glycol-Propyl Glycol (SYSTANE ULTRA OP) Place 1 drop into both eyes daily as needed (Dry eyes).     rosuvastatin (CRESTOR) 20 MG tablet Take 1/2 tablet po once daily 45 tablet 3   cetirizine (ZYRTEC ALLERGY) 10 MG tablet Take 1 tablet (10 mg total) by mouth daily. (Patient not taking: Reported on 06/17/2022) 90 tablet 1   ondansetron (ZOFRAN) 4 MG tablet Take 1 tablet (4 mg total) by mouth every 8 (eight) hours as needed for nausea or vomiting. (Patient not taking: Reported on 06/17/2022) 20 tablet 0    No results found for this or any previous visit (from the past 48 hour(s)). No results found.  Review of Systems  All other systems reviewed and are negative.   Blood pressure 120/72, pulse 70, temperature 98.2 F (36.8 C), temperature source Oral, resp. rate 11, height 5' 4"  (1.626 m), SpO2 100 %. Physical Exam  GENERAL:  The patient is AO x3, in no acute distress. HEENT: Head is normocephalic and atraumatic. EOMI are intact. Mouth is well hydrated and without lesions. NECK: Supple. No masses LUNGS: Clear to auscultation. No presence of rhonchi/wheezing/rales. Adequate chest expansion HEART: RRR, normal s1 and s2. ABDOMEN: Soft, nontender, no guarding, no peritoneal signs, and nondistended. BS +. No masses. EXTREMITIES: Without any cyanosis, clubbing, rash, lesions or edema. NEUROLOGIC: AOx3, no focal motor deficit. SKIN: no jaundice, no rashes  Assessment/Plan 61 year old female with past medical history of pan ulcerative colitis, hyperlipidemia, osteoporosis , who comes to the hospital for  history of ulcerative colitis. We will proceed with colonoscopy.  Harvel Quale, MD 06/19/2022, 10:48 AM

## 2022-06-22 LAB — SURGICAL PATHOLOGY

## 2022-06-23 ENCOUNTER — Encounter (INDEPENDENT_AMBULATORY_CARE_PROVIDER_SITE_OTHER): Payer: Self-pay | Admitting: *Deleted

## 2022-06-25 ENCOUNTER — Encounter (HOSPITAL_COMMUNITY): Payer: Self-pay | Admitting: Gastroenterology

## 2022-07-14 DIAGNOSIS — K5190 Ulcerative colitis, unspecified, without complications: Secondary | ICD-10-CM | POA: Diagnosis not present

## 2022-07-14 DIAGNOSIS — Z8639 Personal history of other endocrine, nutritional and metabolic disease: Secondary | ICD-10-CM | POA: Diagnosis not present

## 2022-07-14 DIAGNOSIS — M810 Age-related osteoporosis without current pathological fracture: Secondary | ICD-10-CM | POA: Diagnosis not present

## 2022-08-03 ENCOUNTER — Encounter: Payer: Self-pay | Admitting: Family Medicine

## 2022-08-03 ENCOUNTER — Ambulatory Visit (INDEPENDENT_AMBULATORY_CARE_PROVIDER_SITE_OTHER): Payer: 59 | Admitting: Family Medicine

## 2022-08-03 VITALS — BP 128/76 | HR 76 | Temp 97.7°F | Ht 64.0 in | Wt 122.0 lb

## 2022-08-03 DIAGNOSIS — B9689 Other specified bacterial agents as the cause of diseases classified elsewhere: Secondary | ICD-10-CM | POA: Diagnosis not present

## 2022-08-03 DIAGNOSIS — J019 Acute sinusitis, unspecified: Secondary | ICD-10-CM

## 2022-08-03 MED ORDER — AMOXICILLIN 500 MG PO CAPS: 500.0000 mg | ORAL_CAPSULE | Freq: Three times a day (TID) | ORAL | 0 refills | Status: AC

## 2022-08-03 NOTE — Progress Notes (Signed)
   Subjective:    Patient ID: Yvonne [name], female    DOB: 07-25-1961, 61 y.o.   MRN: [id number]  HPI Headache, sore throat since saturday  Patient with headache sore throat over the past several days drainage coughing congestion wheezing or difficulty breathing PMH benign patient does have underlying immune issues because she is on Humira for colitis issues.  Denies high fever chills Review of Systems     Objective:   Physical Exam  Mild frontal sinus tenderness eardrums are normal neck no masses lungs clear      Assessment & Plan:  Viral syndrome Antibiotic prescribed Follow-up if progressive troubles Warning signs discussed Testing for COVID recommended Rest up at home over the next couple days

## 2022-08-05 LAB — NOVEL CORONAVIRUS, NAA: SARS-CoV-2, NAA: NOT DETECTED

## 2022-08-05 LAB — SPECIMEN STATUS REPORT

## 2022-08-24 ENCOUNTER — Other Ambulatory Visit (INDEPENDENT_AMBULATORY_CARE_PROVIDER_SITE_OTHER): Payer: Self-pay | Admitting: Internal Medicine

## 2022-08-24 DIAGNOSIS — K51019 Ulcerative (chronic) pancolitis with unspecified complications: Secondary | ICD-10-CM

## 2022-08-24 DIAGNOSIS — K5100 Ulcerative (chronic) pancolitis without complications: Secondary | ICD-10-CM

## 2022-08-24 DIAGNOSIS — R197 Diarrhea, unspecified: Secondary | ICD-10-CM

## 2022-08-24 NOTE — Telephone Encounter (Signed)
Last seen 04/16/22

## 2022-09-01 ENCOUNTER — Telehealth (INDEPENDENT_AMBULATORY_CARE_PROVIDER_SITE_OTHER): Payer: Self-pay | Admitting: *Deleted

## 2022-09-01 NOTE — Telephone Encounter (Signed)
Forms sent in from [facility] to fill out for auth for humira. I filled out forms and faxed backed. Fax from [place] sent in stating humira '40mg'$  is approved from 09/01/22 - 09/02/23.

## 2022-09-25 ENCOUNTER — Encounter (INDEPENDENT_AMBULATORY_CARE_PROVIDER_SITE_OTHER): Payer: Self-pay

## 2022-09-28 NOTE — Telephone Encounter (Signed)
I called to discuss with patient and she told me she spoke with [facility] and they told her it might be some changes to her insurance coming up.

## 2022-09-28 NOTE — Telephone Encounter (Signed)
We never received anything letting us know that this med needed auth. I submitted PA through cover my meds and it told me this PA was resolved and did not need PA. I will call pharmacy this morning when they open.

## 2022-10-01 NOTE — Telephone Encounter (Signed)
I spoke with patient today and she told me she received an email from pharmacy stating she owes 2,937 for her humira. She said her debit card did not expire til 2025 and she spent over an hour on the phone last Friday with [employer] and they told her it was fixed but then she got an email today with amount she owed from pharmacy. I gave her number to [employer] copay support Research scientist (life sciences) for co pay cards Juanna Cao at phone number [phone]. She told me she would call tomorrow and let me know what she finds out.

## 2022-10-02 ENCOUNTER — Encounter (INDEPENDENT_AMBULATORY_CARE_PROVIDER_SITE_OTHER): Payer: Self-pay

## 2022-10-02 NOTE — Telephone Encounter (Signed)
Yvonne [name] called and said patient needs to call [phone] about her past bill and ask the insurance specialist to call over to pharmacy with her on the line. And to call [phone] which is the saving card team.  I called the patient and let her know what she needs to do and then patient told me she has already done this and spoke with insurance specialist that called the pharmacy. She said she was told it was resolved but then a week later got an email that she still owed over $2,900.

## 2022-10-02 NOTE — Telephone Encounter (Signed)
Patient called back and said nobody at that number knew who charmin was and she got switched around and then told to call the pharmacy. She said her bill for over 2,900 is from march of 2023 but she is just now getting billed for it. I called Lindajo Royal rep for [employer] at [phone] and left her a message to return call.

## 2022-10-05 NOTE — Telephone Encounter (Signed)
See other my chart message. Patient sent message that she now has 0 balance and her co pay is $5

## 2022-10-15 ENCOUNTER — Ambulatory Visit (INDEPENDENT_AMBULATORY_CARE_PROVIDER_SITE_OTHER): Payer: 59 | Admitting: Gastroenterology

## 2022-10-19 ENCOUNTER — Ambulatory Visit (INDEPENDENT_AMBULATORY_CARE_PROVIDER_SITE_OTHER): Payer: 59 | Admitting: Gastroenterology

## 2022-10-19 ENCOUNTER — Encounter (INDEPENDENT_AMBULATORY_CARE_PROVIDER_SITE_OTHER): Payer: Self-pay | Admitting: Gastroenterology

## 2022-10-19 VITALS — BP 120/72 | HR 78 | Temp 98.2°F | Ht 64.0 in | Wt 124.6 lb

## 2022-10-19 DIAGNOSIS — K5100 Ulcerative (chronic) pancolitis without complications: Secondary | ICD-10-CM

## 2022-10-19 DIAGNOSIS — Z111 Encounter for screening for respiratory tuberculosis: Secondary | ICD-10-CM | POA: Diagnosis not present

## 2022-10-19 NOTE — Patient Instructions (Addendum)
Continue Humira every 2 weeks Perform blood workup Please update [place] if Humira is no longer covered with your insurance - provide [place] with the alternatives covered by your insurance

## 2022-10-19 NOTE — Progress Notes (Signed)
Maylon Peppers, M.D. Gastroenterology & Hepatology [facility] [address] [place], [place] [address]  Primary Care Physician: Kathyrn Drown, MD [place] [address]  I will communicate my assessment and recommendations to the referring MD via EMR.  Problems: Ulcerative pancolitis in remission  History of Present Illness: Yvonne [name] is a 62 y.o. female with past medical history of ulcerative pancolitis, hyperlipidemia, osteoporosis, who presents for follow up of ulcerative colitis.  The patient was last seen on 04/16/2022. At that time, the patient was continue on Humira every 2 weeks and Apriso 1.5 g qday Colonoscopy was performed on November 2023.  Colonoscopy showed inactive disease with Mayo score of 0.  Random colonic biopsies did not show any active disease but only chronic changes.  Internal hemorrhoids.  She reports that as advised after her last colonoscopy, she stopped using Apriso. She is on Humira biweekly and has tolerated the medication adequately.  Reports that had some issues with insurance coverage but did not have breaks in her treatment. Is concerned as insurance may ask her to switch to a biosimilar starting April but has not heard yet from them regarding the options that will be covered.  She is having a Bm every 1-2 days, stool is formed and no blood is present. The patient denies having any nausea, vomiting, fever, chills, hematochezia, melena, hematemesis, abdominal distention, abdominal pain, diarrhea, jaundice, pruritus or weight loss.  No EIM.  Last flu shot:2023 Last pneumonia shot:2017 Last Pap smear: advised to stop doing these by PCP Last evaluation by dermatology: has an appointment in 2024 Last zoster vaccine: 2022 Last DEXA 04/2022 - consistnet with osteoporosis COVID-19 shot: Moderna x2  Last Colonoscopy: As above  Past Medical History: Past Medical History:  Diagnosis  Date   Anemia    HLD (hyperlipidemia)    Osteoporosis    Ulcerative colitis     Past Surgical History: Past Surgical History:  Procedure Laterality Date   BIOPSY  08/03/2019   Procedure: BIOPSY;  Surgeon: Rogene Houston, MD;  [facility];  Service: Endoscopy;;   BIOPSY  06/19/2022   Procedure: BIOPSY;  Surgeon: Harvel Quale, MD;  [facility];  Service: Gastroenterology;;   COLONOSCOPY N/A 08/03/2019   Procedure: COLONOSCOPY;  Surgeon: Rogene Houston, MD;  [facility];  Service: Endoscopy;  Laterality: N/A;  930   COLONOSCOPY WITH PROPOFOL N/A 06/19/2022   Procedure: COLONOSCOPY WITH PROPOFOL;  Surgeon: Harvel Quale, MD;  [facility];  Service: Gastroenterology;  Laterality: N/A;  1115 ASA 2   PARTIAL HYSTERECTOMY      Family History: Family History  Problem Relation Age of Onset   Colon cancer Neg Hx    Diabetes Neg Hx    Heart disease Neg Hx     Social History: Social History   Tobacco Use  Smoking Status Never   Passive exposure: Never  Smokeless Tobacco Never   Social History   Substance and Sexual Activity  Alcohol Use No   Alcohol/week: 0.0 standard drinks of alcohol   Social History   Substance and Sexual Activity  Drug Use No    Allergies: No Known Allergies  Medications: Current Outpatient Medications  Medication Sig Dispense Refill   alendronate (FOSAMAX) 70 MG tablet Take 70 mg by mouth once a week. Take with a full glass of water on an empty stomach.     Biotin w/ Vitamins C &  E (HAIR/SKIN/NAILS PO) Take 5,000 mcg by mouth 2 (two) times daily.     Calcium Carb-Cholecalciferol (CALCIUM + VITAMIN D3 PO) Take by mouth 2 (two) times daily. Calicum [id number] mg, Vitamin D3 500 IU     cetirizine (ZYRTEC ALLERGY) 10 MG tablet Take 1 tablet (10 mg total) by mouth daily. (Patient taking differently: Take 10 mg by mouth daily. As needed) 90 tablet 1   cholecalciferol (VITAMIN D3) 25 MCG  (1000 UNIT) tablet Take 1,000 Units by mouth 2 (two) times daily. This is a gel capsule.     HUMIRA, 2 PEN, 40 MG/0.4ML PNKT INJECT 1 PEN UNDER THE SKIN EVERY 14 DAYS. 2 each 11   Krill Oil 1000 MG CAPS Take 1 capsule (1,000 mg total) by mouth 2 (two) times daily. (Patient taking differently: Take 1,000 mg by mouth 2 (two) times daily.)     Phenylephrine HCl (SINEX REGULAR NA) Place 1 spray into the nose daily as needed (Congestion).     Polyethyl Glycol-Propyl Glycol (SYSTANE ULTRA OP) Place 1 drop into both eyes daily as needed (Dry eyes).     rosuvastatin (CRESTOR) 20 MG tablet Take 1/2 tablet po once daily 45 tablet 3   No current facility-administered medications for this visit.    Review of Systems: GENERAL: negative for malaise, night sweats HEENT: No changes in hearing or vision, no nose bleeds or other nasal problems. NECK: Negative for lumps, goiter, pain and significant neck swelling RESPIRATORY: Negative for cough, wheezing CARDIOVASCULAR: Negative for chest pain, leg swelling, palpitations, orthopnea GI: SEE HPI MUSCULOSKELETAL: Negative for joint pain or swelling, back pain, and muscle pain. SKIN: Negative for lesions, rash PSYCH: Negative for sleep disturbance, mood disorder and recent psychosocial stressors. HEMATOLOGY Negative for prolonged bleeding, bruising easily, and swollen nodes. ENDOCRINE: Negative for cold or heat intolerance, polyuria, polydipsia and goiter. NEURO: negative for tremor, gait imbalance, syncope and seizures. The remainder of the review of systems is noncontributory.   Physical Exam: BP 120/72 (BP Location: Left Arm, Patient Position: Sitting, Cuff Size: Normal)   Pulse 78   Temp 98.2 F (36.8 C) (Oral)   Ht '5\' 4"'$  (1.626 m)   Wt 124 lb 9.6 oz (56.5 kg)   BMI 21.39 kg/m  GENERAL: The patient is AO x3, in no acute distress. HEENT: Head is normocephalic and atraumatic. EOMI are intact. Mouth is well hydrated and without lesions. NECK: Supple.  No masses LUNGS: Clear to auscultation. No presence of rhonchi/wheezing/rales. Adequate chest expansion HEART: RRR, normal s1 and s2. ABDOMEN: Soft, nontender, no guarding, no peritoneal signs, and nondistended. BS +. No masses. EXTREMITIES: Without any cyanosis, clubbing, rash, lesions or edema. NEUROLOGIC: AOx3, no focal motor deficit. SKIN: no jaundice, no rashes  Imaging/Labs: as above  I personally reviewed and interpreted the available labs, imaging and endoscopic files.  Impression and Plan: Yvonne [name] is a 62 y.o. female with past medical history of ulcerative pancolitis, hyperlipidemia, osteoporosis, who presents for follow up of ulcerative colitis.  The patient has been doing well on Humira biweekly and has actually achieved endoscopic and microscopic remission.  Did not have any worsening of her symptoms after mesalamine was stopped.  Will continue her on Humira 40 mg every 2 weeks.  Notably, I advised her to keep [place] updated if the coverage for Humira changes as we would like to avoid breaks treatment as much as possible.  Will update surveillance blood workup today.  Regarding her preventative measures, she is up-to-date in terms of her  vaccinations.  - Continue Humira every 2 weeks - Check CBC, CMP, Quantiferon and CRP - Patient to update [place] if Humira is no longer covered with insurance - she will need provide [place] with the alternatives covered by insurance  All questions were answered.      Maylon Peppers, MD Gastroenterology and Hepatology [facility]

## 2022-10-20 DIAGNOSIS — Z111 Encounter for screening for respiratory tuberculosis: Secondary | ICD-10-CM | POA: Diagnosis not present

## 2022-10-20 DIAGNOSIS — K5100 Ulcerative (chronic) pancolitis without complications: Secondary | ICD-10-CM | POA: Diagnosis not present

## 2022-10-26 LAB — CBC WITH DIFFERENTIAL/PLATELET
Basophils Absolute: 0 10*3/uL (ref 0.0–0.2)
Basos: 1 %
EOS (ABSOLUTE): 0 10*3/uL (ref 0.0–0.4)
Eos: 0 %
Hematocrit: 38.4 % (ref 34.0–46.6)
Hemoglobin: 12.2 g/dL (ref 11.1–15.9)
Immature Grans (Abs): 0 10*3/uL (ref 0.0–0.1)
Immature Granulocytes: 0 %
Lymphocytes Absolute: 1.8 10*3/uL (ref 0.7–3.1)
Lymphs: 38 %
MCH: 29.7 pg (ref 26.6–33.0)
MCHC: 31.8 g/dL (ref 31.5–35.7)
MCV: 93 fL (ref 79–97)
Monocytes Absolute: 0.3 10*3/uL (ref 0.1–0.9)
Monocytes: 7 %
Neutrophils Absolute: 2.6 10*3/uL (ref 1.4–7.0)
Neutrophils: 54 %
Platelets: 261 10*3/uL (ref 150–450)
RBC: 4.11 x10E6/uL (ref 3.77–5.28)
RDW: 12.5 % (ref 11.7–15.4)
WBC: 4.8 10*3/uL (ref 3.4–10.8)

## 2022-10-26 LAB — COMPREHENSIVE METABOLIC PANEL
ALT: 15 IU/L (ref 0–32)
AST: 20 IU/L (ref 0–40)
Albumin/Globulin Ratio: 1.6 (ref 1.2–2.2)
Albumin: 4.7 g/dL (ref 3.8–4.9)
Alkaline Phosphatase: 56 IU/L (ref 44–121)
BUN/Creatinine Ratio: 19 (ref 12–28)
BUN: 14 mg/dL (ref 8–27)
Bilirubin Total: 0.4 mg/dL (ref 0.0–1.2)
CO2: 24 mmol/L (ref 20–29)
Calcium: 10.2 mg/dL (ref 8.7–10.3)
Chloride: 103 mmol/L (ref 96–106)
Creatinine, Ser: 0.75 mg/dL (ref 0.57–1.00)
Globulin, Total: 2.9 g/dL (ref 1.5–4.5)
Glucose: 81 mg/dL (ref 70–99)
Potassium: 4.8 mmol/L (ref 3.5–5.2)
Sodium: 141 mmol/L (ref 134–144)
Total Protein: 7.6 g/dL (ref 6.0–8.5)
eGFR: 91 mL/min/{1.73_m2} (ref >59)

## 2022-10-26 LAB — QUANTIFERON-TB GOLD PLUS
QuantiFERON Mitogen Value: 8.88 IU/mL
QuantiFERON Nil Value: 0.04 IU/mL
QuantiFERON TB1 Ag Value: 0.03 IU/mL
QuantiFERON TB2 Ag Value: 0.03 IU/mL
QuantiFERON-TB Gold Plus: NEGATIVE

## 2022-10-26 LAB — C-REACTIVE PROTEIN: CRP: <1 mg/L (ref 0–10)

## 2022-11-18 ENCOUNTER — Other Ambulatory Visit: Payer: Self-pay | Admitting: *Deleted

## 2022-11-18 DIAGNOSIS — R197 Diarrhea, unspecified: Secondary | ICD-10-CM

## 2022-11-18 DIAGNOSIS — K5100 Ulcerative (chronic) pancolitis without complications: Secondary | ICD-10-CM

## 2022-11-18 DIAGNOSIS — K51019 Ulcerative (chronic) pancolitis with unspecified complications: Secondary | ICD-10-CM

## 2022-11-20 ENCOUNTER — Other Ambulatory Visit: Payer: Self-pay | Admitting: *Deleted

## 2022-11-20 ENCOUNTER — Telehealth: Payer: Self-pay | Admitting: *Deleted

## 2022-11-20 DIAGNOSIS — K51019 Ulcerative (chronic) pancolitis with unspecified complications: Secondary | ICD-10-CM

## 2022-11-20 DIAGNOSIS — K5100 Ulcerative (chronic) pancolitis without complications: Secondary | ICD-10-CM

## 2022-11-20 DIAGNOSIS — R197 Diarrhea, unspecified: Secondary | ICD-10-CM

## 2022-11-20 MED ORDER — ADALIMUMAB-ADAZ 40 MG/0.4ML ~~LOC~~ SOAJ: 1.0000 | SUBCUTANEOUS | 11 refills | Status: DC

## 2022-11-20 NOTE — Telephone Encounter (Signed)
Patient needs biosimilar med for humira sent to [facility] specialty. She takes humira 40mg /0.10ml every 14 days. Pharmacy told me there were several different ones - (hyrimoz or adalimumab-adaz)   Please advise.   Pt would like for me to call her back next week after its been sent in and let her know what her co pay would be.

## 2022-11-20 NOTE — Telephone Encounter (Signed)
Sent Hyrimoz to specialty pharmacy, can take it at the regular schedule, no need to have a gap in between doses Thanks

## 2022-11-20 NOTE — Telephone Encounter (Signed)
Hyrimoz 40 mg every 2 weeks sent to pharmacy

## 2022-11-23 NOTE — Telephone Encounter (Signed)
Called pharmacy to get the cost and was told med needed PA. PA submitted through cover my meds. Await response.

## 2022-11-24 NOTE — Telephone Encounter (Signed)
PA still pending.  

## 2022-11-24 NOTE — Telephone Encounter (Signed)
Patient called and given update that PA still pending. Will call insurance tomorrow if we dont have decision.

## 2022-11-25 NOTE — Telephone Encounter (Signed)
Fax from [place]. Coverage for hyrimoz is approved from 11/24/22 - 11/24/23.

## 2022-11-26 NOTE — Telephone Encounter (Signed)
Called pharmacy and med co pay is $500. There is a co pay card pt can call to get. Phone number pharmacy gave me was [phone]. Called and discussed with patient and pt advised to call me back if she has any issues getting the medication.

## 2023-01-12 DIAGNOSIS — M810 Age-related osteoporosis without current pathological fracture: Secondary | ICD-10-CM | POA: Diagnosis not present

## 2023-01-19 DIAGNOSIS — M810 Age-related osteoporosis without current pathological fracture: Secondary | ICD-10-CM | POA: Diagnosis not present

## 2023-01-19 DIAGNOSIS — Z8639 Personal history of other endocrine, nutritional and metabolic disease: Secondary | ICD-10-CM | POA: Diagnosis not present

## 2023-02-22 ENCOUNTER — Ambulatory Visit (INDEPENDENT_AMBULATORY_CARE_PROVIDER_SITE_OTHER): Payer: 59 | Admitting: Gastroenterology

## 2023-03-12 DIAGNOSIS — L650 Telogen effluvium: Secondary | ICD-10-CM | POA: Diagnosis not present

## 2023-03-12 DIAGNOSIS — L538 Other specified erythematous conditions: Secondary | ICD-10-CM | POA: Diagnosis not present

## 2023-03-12 DIAGNOSIS — L820 Inflamed seborrheic keratosis: Secondary | ICD-10-CM | POA: Diagnosis not present

## 2023-03-12 DIAGNOSIS — L814 Other melanin hyperpigmentation: Secondary | ICD-10-CM | POA: Diagnosis not present

## 2023-03-12 DIAGNOSIS — L821 Other seborrheic keratosis: Secondary | ICD-10-CM | POA: Diagnosis not present

## 2023-03-12 DIAGNOSIS — D225 Melanocytic nevi of trunk: Secondary | ICD-10-CM | POA: Diagnosis not present

## 2023-03-15 DIAGNOSIS — R300 Dysuria: Secondary | ICD-10-CM | POA: Diagnosis not present

## 2023-03-23 ENCOUNTER — Other Ambulatory Visit: Payer: Self-pay | Admitting: Family Medicine

## 2023-03-23 DIAGNOSIS — J301 Allergic rhinitis due to pollen: Secondary | ICD-10-CM

## 2023-03-30 ENCOUNTER — Encounter: Payer: Self-pay | Admitting: Family Medicine

## 2023-03-30 MED ORDER — FLUTICASONE PROPIONATE 50 MCG/ACT NA SUSP: 2.0000 | Freq: Every day | NASAL | 12 refills | Status: AC

## 2023-03-30 NOTE — Telephone Encounter (Signed)
May have refill of Flonase, 2 sprays each nare daily, 12 refills

## 2023-04-01 ENCOUNTER — Encounter (INDEPENDENT_AMBULATORY_CARE_PROVIDER_SITE_OTHER): Payer: Self-pay | Admitting: Gastroenterology

## 2023-04-02 DIAGNOSIS — M810 Age-related osteoporosis without current pathological fracture: Secondary | ICD-10-CM | POA: Diagnosis not present

## 2023-04-26 ENCOUNTER — Ambulatory Visit (INDEPENDENT_AMBULATORY_CARE_PROVIDER_SITE_OTHER): Payer: 59 | Admitting: Gastroenterology

## 2023-05-04 DIAGNOSIS — Z6821 Body mass index (BMI) 21.0-21.9, adult: Secondary | ICD-10-CM | POA: Diagnosis not present

## 2023-05-04 DIAGNOSIS — Z1231 Encounter for screening mammogram for malignant neoplasm of breast: Secondary | ICD-10-CM | POA: Diagnosis not present

## 2023-05-04 DIAGNOSIS — Z01419 Encounter for gynecological examination (general) (routine) without abnormal findings: Secondary | ICD-10-CM | POA: Diagnosis not present

## 2023-05-11 ENCOUNTER — Ambulatory Visit (INDEPENDENT_AMBULATORY_CARE_PROVIDER_SITE_OTHER): Payer: 59 | Admitting: Family Medicine

## 2023-05-11 VITALS — BP 122/70 | HR 63 | Temp 97.3°F | Ht 64.0 in | Wt 120.0 lb

## 2023-05-11 DIAGNOSIS — Z23 Encounter for immunization: Secondary | ICD-10-CM

## 2023-05-11 DIAGNOSIS — Z79899 Other long term (current) drug therapy: Secondary | ICD-10-CM

## 2023-05-11 DIAGNOSIS — M810 Age-related osteoporosis without current pathological fracture: Secondary | ICD-10-CM | POA: Diagnosis not present

## 2023-05-11 DIAGNOSIS — E7849 Other hyperlipidemia: Secondary | ICD-10-CM

## 2023-05-11 MED ORDER — ROSUVASTATIN CALCIUM 10 MG PO TABS: ORAL_TABLET | ORAL | 12 refills | Status: DC

## 2023-05-11 NOTE — Progress Notes (Signed)
Subjective:    Patient ID: Yvonne [name], female    DOB: 1960/09/17, 62 y.o.   MRN: [id number]  HPI  Yearly check for hyperlipidemia - refills for rosuvastatin 20mg   Patient here for follow-up regarding cholesterol.    Patient relates taking medication on a regular basis Denies problems with medication Importance of dietary measures discussed Regular lab work regarding lipid and liver was checked and if needing additional labs was appropriately ordered She is under the care of of specialist for Prolia Review of Systems     Objective:   Physical Exam  General-in no acute distress Eyes-no discharge Lungs-respiratory rate normal, CTA CV-no murmurs,RRR Extremities skin warm dry no edema Neuro grossly normal Behavior normal, alert       Assessment & Plan:   1. Immunization due Today - Flu vaccine trivalent PF, 6mos and older(Flulaval,Afluria,Fluarix,Fluzone)  2. Other hyperlipidemia Healthy diet regular activity continue medication - Lipid Panel  3. High risk medication use Check labs - ALT Follow-up in 1 year

## 2023-05-21 DIAGNOSIS — E7849 Other hyperlipidemia: Secondary | ICD-10-CM | POA: Diagnosis not present

## 2023-05-21 DIAGNOSIS — Z79899 Other long term (current) drug therapy: Secondary | ICD-10-CM | POA: Diagnosis not present

## 2023-05-22 LAB — LIPID PANEL
Chol/HDL Ratio: 2.6 {ratio} (ref 0.0–4.4)
Cholesterol, Total: 212 mg/dL — ABNORMAL HIGH (ref 100–199)
HDL: 82 mg/dL (ref >39)
LDL Chol Calc (NIH): 120 mg/dL — ABNORMAL HIGH (ref 0–99)
Triglycerides: 58 mg/dL (ref 0–149)
VLDL Cholesterol Cal: 10 mg/dL (ref 5–40)

## 2023-05-22 LAB — ALT: ALT: 15 [IU]/L (ref 0–32)

## 2023-05-23 ENCOUNTER — Encounter: Payer: Self-pay | Admitting: Family Medicine

## 2023-05-24 ENCOUNTER — Ambulatory Visit (INDEPENDENT_AMBULATORY_CARE_PROVIDER_SITE_OTHER): Payer: 59 | Admitting: Gastroenterology

## 2023-05-24 ENCOUNTER — Encounter (INDEPENDENT_AMBULATORY_CARE_PROVIDER_SITE_OTHER): Payer: Self-pay | Admitting: Gastroenterology

## 2023-05-24 VITALS — BP 124/71 | HR 92 | Temp 98.5°F | Ht 64.0 in | Wt 122.5 lb

## 2023-05-24 DIAGNOSIS — K5100 Ulcerative (chronic) pancolitis without complications: Secondary | ICD-10-CM

## 2023-05-24 DIAGNOSIS — Z1159 Encounter for screening for other viral diseases: Secondary | ICD-10-CM | POA: Insufficient documentation

## 2023-05-24 DIAGNOSIS — Z79620 Long term (current) use of immunosuppressive biologic: Secondary | ICD-10-CM | POA: Diagnosis not present

## 2023-05-24 NOTE — Telephone Encounter (Signed)
Nurses I did review [place] message. I would be fine with maintaining rosuvastatin at 10 mg a day May add ezetimibe 10 mg 1 daily, #30, 12 refills  I recommend that she repeat a lipid profile in approximately 8 to 12 weeks Ezetimibe typically well-tolerated does not interfere with liver if any problems feel free to let us know

## 2023-05-24 NOTE — Patient Instructions (Addendum)
We will continue Hyrimoz every 2 weeks We will update Hepatitis B testing today We will repeat inflammatory markers at next visit Let me know if you have any new GI symptoms  Follow up 6 months  It was a pleasure to see you today. I want to create trusting relationships with patients and provide genuine, compassionate, and quality care. I truly value your feedback! please be on the lookout for a survey regarding your visit with me today. I appreciate your input about our visit and your time in completing this!    Tyshaun Vinzant L. Jeanmarie Hubert, MSN, APRN, AGNP-C Adult-Gerontology Nurse Practitioner [facility] Gastroenterology at [facility]

## 2023-05-24 NOTE — Progress Notes (Signed)
Referring Provider: Babs Sciara, MD Primary Care Physician:  Babs Sciara, MD Primary GI Physician: Dr. Levon Hedger   Chief Complaint  Patient presents with   Follow-up    Pt arrives for follow up. Pt states she is doing well.    HPI:   Yvonne [name] is a 62 y.o. female with past medical history of Ulcerative pancolitis, HLD, osteoporosis  Patient presenting today for follow up of UC  Last seen march 2024, only on Humira as apriso had been stopped after coonoscopy. She had been asked to switch to biosimilar in April due to insurance coverage. Having a Bm every 1-2 days, with formed stool, no rectal bleeding. She denied EIM   Recommended to continue Humira every 2 weeks, CBC, CMP, CPR, TB quant Labs in March all WNL.  Present:  Doing well. She notes usually has a BM daily, sometimes may skip a day. Stools are usually softer in consistency but formed, denies diarrhea. No rectal bleeding or melena. No abdominal pain. Appetite is good, no weight loss. No EIMs. She has no GI complaints. She works out at [employer] twice a week and tries to stay active.    Last flu shot:October 2024  Last pneumonia shot: 2017 Last Pap smear: advised to stop doing these by PCP Mammogram: done in 2024  Last evaluation by dermatology: July 2024 Last zoster vaccine: 2022 Last DEXA 04/2022 - consistnet with osteoporosis COVID-19 shot: Moderna x2   Last Colonoscopy: 06/2022 inactive disease with Mayo score of 0.  Random colonic biopsies did not show any active disease but only chronic changes.  Internal hemorrhoids.  Repeat colonoscopy 3 years  Past Medical History:  Diagnosis Date   Anemia    HLD (hyperlipidemia)    Osteoporosis    Ulcerative colitis     Past Surgical History:  Procedure Laterality Date   BIOPSY  08/03/2019   Procedure: BIOPSY;  Surgeon: Malissa Hippo, MD;  [facility];  Service: Endoscopy;;   BIOPSY  06/19/2022   Procedure: BIOPSY;  Surgeon: Dolores Frame, MD;  [facility];  Service: Gastroenterology;;   COLONOSCOPY N/A 08/03/2019   Procedure: COLONOSCOPY;  Surgeon: Malissa Hippo, MD;  [facility];  Service: Endoscopy;  Laterality: N/A;  930   COLONOSCOPY WITH PROPOFOL N/A 06/19/2022   Procedure: COLONOSCOPY WITH PROPOFOL;  Surgeon: Dolores Frame, MD;  [facility];  Service: Gastroenterology;  Laterality: N/A;  1115 ASA 2   PARTIAL HYSTERECTOMY      Current Outpatient Medications  Medication Sig Dispense Refill   Adalimumab-adaz (HYRIMOZ) 40 MG/0.4ML SOAJ Inject 1 each into the skin every 14 (fourteen) days. 0.8 mL 11   Biotin w/ Vitamins C & E (HAIR/SKIN/NAILS PO) Take 5,000 mcg by mouth 2 (two) times daily.     Calcium Carb-Cholecalciferol (CALCIUM + VITAMIN D3 PO) Take by mouth 2 (two) times daily. Calicum 400 mg, Vitamin D3 500 IU     cetirizine (ZYRTEC ALLERGY) 10 MG tablet Take 1 tablet (10 mg total) by mouth daily. 90 tablet 1   cholecalciferol (VITAMIN D3) 25 MCG (1000 UNIT) tablet Take 1,000 Units by mouth 2 (two) times daily. This is a gel capsule.     denosumab (PROLIA) 60 MG/ML SOSY injection Inject 60 mg into the skin every 6 (six) months.     fluticasone (FLONASE) 50 MCG/ACT nasal spray Place 2 sprays into both nostrils daily. 16 g 12   Krill Oil 1000 MG  CAPS Take 1 capsule (1,000 mg total) by mouth 2 (two) times daily. (Patient taking differently: Take 1,000 mg by mouth 2 (two) times daily.)     rosuvastatin (CRESTOR) 10 MG tablet Take 1 tablet po once daily 30 tablet 12   No current facility-administered medications for this visit.    Allergies as of 05/24/2023   (No Known Allergies)    Family History  Problem Relation Age of Onset   Colon cancer Neg Hx    Diabetes Neg Hx    Heart disease Neg Hx     Social History   Socioeconomic History   Marital status: Married    Spouse name: Not on file   Number of children: 2   Years of education: Not on file    Highest education level: Not on file  Occupational History   Occupation: cleaning bussiness-self employed    Employer: NOT EMPLOYED  Tobacco Use   Smoking status: Never    Passive exposure: Never   Smokeless tobacco: Never  Vaping Use   Vaping status: Never Used  Substance and Sexual Activity   Alcohol use: No    Alcohol/week: 0.0 standard drinks of alcohol   Drug use: No   Sexual activity: Not on file  Other Topics Concern   Not on file  Social History Narrative   Not on file   Social Determinants of Health   Financial Resource Strain: Not on file  Food Insecurity: Not on file  Transportation Needs: Not on file  Physical Activity: Not on file  Stress: Not on file  Social Connections: Not on file    Review of systems General: negative for malaise, night sweats, fever, chills, weight loss Neck: Negative for lumps, goiter, pain and significant neck swelling Resp: Negative for cough, wheezing, dyspnea at rest CV: Negative for chest pain, leg swelling, palpitations, orthopnea GI: denies melena, hematochezia, nausea, vomiting, diarrhea, constipation, dysphagia, odyonophagia, early satiety or unintentional weight loss.  MSK: Negative for joint pain or swelling, back pain, and muscle pain. Derm: Negative for itching or rash Psych: Denies depression, anxiety, memory loss, confusion. No homicidal or suicidal ideation.  Heme: Negative for prolonged bleeding, bruising easily, and swollen nodes. Endocrine: Negative for cold or heat intolerance, polyuria, polydipsia and goiter. Neuro: negative for tremor, gait imbalance, syncope and seizures. The remainder of the review of systems is noncontributory.  Physical Exam: BP 124/71   Pulse 92   Temp 98.5 F (36.9 C)   Ht 5\' 4"  (1.626 m)   Wt 122 lb 8 oz (55.6 kg)   BMI 21.03 kg/m  General:   Alert and oriented. No distress noted. Pleasant and cooperative.  Head:  Normocephalic and atraumatic. Eyes:  Conjuctiva clear without  scleral icterus. Mouth:  Oral mucosa pink and moist. Good dentition. No lesions. Heart: Normal rate and rhythm, s1 and s2 heart sounds present.  Lungs: Clear lung sounds in all lobes. Respirations equal and unlabored. Abdomen:  +BS, soft, non-tender and non-distended. No rebound or guarding. No HSM or masses noted. Derm: No palmar erythema or jaundice Msk:  Symmetrical without gross deformities. Normal posture. Extremities:  Without edema. Neurologic:  Alert and  oriented x4 Psych:  Alert and cooperative. Normal mood and affect.  Invalid input(s): "6 MONTHS"   ASSESSMENT: Yvonne [name] is a 62 y.o. female presenting today for follow up of UC  UC: now on Hyrimoz every 2 weeks, doing well on this. Colonoscopy in November 2023 with endoscopic remission, inflammatory markers in March were normal.  She is having a BM daily to every other day without abdominal pain, diarrhea, rectal bleeding. Will continue on Hyrimoz every 2 weeks. Will update Hepatitis BsAg as last was in September of 2023, she is up to date on TB testing. Given recently normal inflammatory markers, endoscopic/clinical remission, will plan to recheck inflammatory markers and routine labs again in 6 months.    PLAN:  Continue Hyrimoz 40mg  every 2 weeks  2. Hep BsAg  3. Check CBC, CMP, CRP, fecal calprotectin in April   All questions were answered, patient verbalized understanding and is in agreement with plan as outlined above.   Follow Up: 6 months   Rihanna Marseille L. Jeanmarie Hubert, MSN, APRN, AGNP-C Adult-Gerontology Nurse Practitioner [facility]  I have reviewed the note and agree with the APP's assessment as described in this progress note  Katrinka Blazing, MD Gastroenterology and Hepatology [facility]astroenterology

## 2023-05-25 ENCOUNTER — Other Ambulatory Visit: Payer: Self-pay

## 2023-05-25 DIAGNOSIS — E7849 Other hyperlipidemia: Secondary | ICD-10-CM

## 2023-05-25 LAB — HEPATITIS B SURFACE ANTIGEN: Hepatitis B Surface Ag: NONREACTIVE

## 2023-05-25 MED ORDER — EZETIMIBE 10 MG PO TABS: 10.0000 mg | ORAL_TABLET | Freq: Every day | ORAL | 12 refills | Status: DC

## 2023-07-19 DIAGNOSIS — M810 Age-related osteoporosis without current pathological fracture: Secondary | ICD-10-CM | POA: Diagnosis not present

## 2023-07-20 ENCOUNTER — Ambulatory Visit (INDEPENDENT_AMBULATORY_CARE_PROVIDER_SITE_OTHER): Payer: 59 | Admitting: Physician Assistant

## 2023-07-20 ENCOUNTER — Encounter: Payer: Self-pay | Admitting: Physician Assistant

## 2023-07-20 VITALS — BP 126/78 | Temp 97.0°F | Ht 64.0 in | Wt 123.2 lb

## 2023-07-20 DIAGNOSIS — N3001 Acute cystitis with hematuria: Secondary | ICD-10-CM

## 2023-07-20 DIAGNOSIS — R300 Dysuria: Secondary | ICD-10-CM | POA: Diagnosis not present

## 2023-07-20 LAB — POCT URINALYSIS DIPSTICK
Spec Grav, UA: 1.01 (ref 1.010–1.025)
pH, UA: 6 (ref 5.0–8.0)

## 2023-07-20 MED ORDER — CEPHALEXIN 500 MG PO CAPS: 500.0000 mg | ORAL_CAPSULE | Freq: Two times a day (BID) | ORAL | 0 refills | Status: AC

## 2023-07-20 NOTE — Progress Notes (Signed)
   Acute Office Visit  Subjective:     Patient ID: Yvonne [name], female    DOB: Mar 26, 1961, 62 y.o.   MRN: [id number]   Patient presents today with concern for UTI. She states this morning she woke up with intense urgency and suprapubic pressure. She noticed blood in her urine and a strong odor when she first used the restroom. She denies burning with urination and states urgency has decreased throughout the day. She denies fever. She reports history of UTI with similar symptoms.       Review of Systems  Genitourinary:  Positive for dysuria, hematuria and urgency.        Objective:     BP 126/78   Temp (!) 97 F (36.1 C) (Oral)   Ht 5\' 4"  (1.626 m)   Wt 123 lb 3.2 oz (55.9 kg)   BMI 21.15 kg/m   Physical Exam Constitutional:      Appearance: Normal appearance.  Eyes:     Extraocular Movements: Extraocular movements intact.     Pupils: Pupils are equal, round, and reactive to light.  Cardiovascular:     Rate and Rhythm: Normal rate.  Pulmonary:     Breath sounds: Normal breath sounds.  Abdominal:     General: Abdomen is flat. There is no distension.     Palpations: Abdomen is soft.     Tenderness: There is no abdominal tenderness.  Neurological:     General: No focal deficit present.     Mental Status: She is alert. Mental status is at baseline.  Psychiatric:        Mood and Affect: Mood normal.        Behavior: Behavior normal.        Thought Content: Thought content normal.        Judgment: Judgment normal.     Results for orders placed or performed in visit on 07/20/23  POCT urinalysis dipstick  Result Value Ref Range   Color, UA     Clarity, UA     Glucose, UA     Bilirubin, UA     Ketones, UA     Spec Grav, UA 1.010 1.010 - 1.025   Blood, UA large    pH, UA 6.0 5.0 - 8.0   Protein, UA     Urobilinogen, UA     Nitrite, UA     Leukocytes, UA Moderate (2+) (A) Negative   Appearance     Odor          Assessment & Plan:  Acute cystitis  with hematuria Assessment & Plan: Patient with urine dipstick positive for blood and leukocytes. Urine sent for culture. Keflex 500mg  BID x5 days sent pending culture. Patient advised to increase fluid intake. She was informed she could take OTC Azo for dysuria if she needs relief.   Orders: -     Cephalexin; Take 1 capsule (500 mg total) by mouth 2 (two) times daily for 5 days.  Dispense: 10 capsule; Refill: 0 -     Urine Culture; Future  Dysuria -     POCT urinalysis dipstick -     Urine Culture; Future   Return if symptoms worsen or fail to improve.  Toni Amend Marily Konczal, PA-C

## 2023-07-20 NOTE — Assessment & Plan Note (Signed)
Patient with urine dipstick positive for blood and leukocytes. Urine sent for culture. Keflex 500mg  BID x5 days sent pending culture. Patient advised to increase fluid intake. She was informed she could take OTC Azo for dysuria if she needs relief.

## 2023-07-20 NOTE — Addendum Note (Signed)
Addended by: Alm Bustard R on: 07/20/2023 04:28 PM   Modules accepted: Orders

## 2023-07-23 LAB — URINE CULTURE

## 2023-07-27 DIAGNOSIS — Z8639 Personal history of other endocrine, nutritional and metabolic disease: Secondary | ICD-10-CM | POA: Diagnosis not present

## 2023-07-27 DIAGNOSIS — M810 Age-related osteoporosis without current pathological fracture: Secondary | ICD-10-CM | POA: Diagnosis not present

## 2023-07-27 DIAGNOSIS — E782 Mixed hyperlipidemia: Secondary | ICD-10-CM | POA: Diagnosis not present

## 2023-08-16 DIAGNOSIS — E7849 Other hyperlipidemia: Secondary | ICD-10-CM | POA: Diagnosis not present

## 2023-08-17 LAB — LIPID PANEL
Chol/HDL Ratio: 2.2 {ratio} (ref 0.0–4.4)
Cholesterol, Total: 130 mg/dL (ref 100–199)
HDL: 58 mg/dL (ref >39)
LDL Chol Calc (NIH): 57 mg/dL (ref 0–99)
Triglycerides: 74 mg/dL (ref 0–149)
VLDL Cholesterol Cal: 15 mg/dL (ref 5–40)

## 2023-10-15 DIAGNOSIS — M810 Age-related osteoporosis without current pathological fracture: Secondary | ICD-10-CM | POA: Diagnosis not present

## 2023-10-18 ENCOUNTER — Other Ambulatory Visit: Payer: Self-pay | Admitting: Gastroenterology

## 2023-10-18 DIAGNOSIS — K5100 Ulcerative (chronic) pancolitis without complications: Secondary | ICD-10-CM

## 2023-10-26 ENCOUNTER — Encounter (INDEPENDENT_AMBULATORY_CARE_PROVIDER_SITE_OTHER): Payer: Self-pay

## 2023-10-29 NOTE — Telephone Encounter (Signed)
 Forms for PA was faxed on 10/28/23. Await response

## 2023-11-04 ENCOUNTER — Telehealth (INDEPENDENT_AMBULATORY_CARE_PROVIDER_SITE_OTHER): Payer: Self-pay

## 2023-11-04 NOTE — Telephone Encounter (Signed)
 Hyrimoz approved per [employer] from 11/02/2023-11/01/2024.

## 2023-11-09 ENCOUNTER — Ambulatory Visit: Admitting: Family Medicine

## 2023-11-09 VITALS — BP 145/82 | HR 79 | Temp 98.4°F | Ht 64.0 in | Wt 122.0 lb

## 2023-11-09 DIAGNOSIS — R030 Elevated blood-pressure reading, without diagnosis of hypertension: Secondary | ICD-10-CM

## 2023-11-09 DIAGNOSIS — R519 Headache, unspecified: Secondary | ICD-10-CM | POA: Diagnosis not present

## 2023-11-09 DIAGNOSIS — J3489 Other specified disorders of nose and nasal sinuses: Secondary | ICD-10-CM

## 2023-11-09 NOTE — Progress Notes (Signed)
   Subjective:    Patient ID: Yvonne [name], female    DOB: 1961-01-12, 63 y.o.   MRN: [id number]  HPI sinus congestion and headache  Dizzy last night and felt nauseous afterwards.   High BP readings last night and this AM   Discussed the use of AI scribe software for clinical note transcription with the patient, who gave verbal consent to proceed.  History of Present Illness   Yvonne [name] is a 63 year old female who presents with headaches and dizziness.  She has been experiencing headaches for the past couple of weeks, primarily located behind her eyes and sometimes spreading to the frontal region. The headaches are more pronounced in the mornings and can be triggered by changes in position, such as turning over in bed. The pain is described as dull and occasionally wakes her up at night. She has been using acetaminophen and Excedrin (without aspirin) for relief, which provides some help. No discolored nasal discharge is noted, with mucus being clear and watery.  She experienced an episode of dizziness the previous night while watching TV, where the room started spinning for about five minutes, accompanied by nausea but no vomiting. Closing her eyes helped the sensation subside. No one-sided numbness, weakness, or slurred speech is reported.  She has a history of sinus congestion and has been using Flonase, administering one spray in each nostril twice a day, but is unsure if she is using it correctly. She has been consistent with this treatment for several weeks.  She mentions a history of neck pain, which she attributes to her exercise routine, specifically bar exercises, and notes that it sometimes radiates to her head.  She has not been monitoring her blood pressure regularly but notes that a recent reading taken by her husband was higher than usual. There is no family history of high blood pressure, and she has not experienced similar symptoms before the past couple of weeks.  In  terms of lifestyle, she engages in regular physical activity, including bar, body pump, and body balance exercises, and tries to maintain a healthy diet, although she admits to liking salty foods. She has not noticed headaches during exercise.       Review of Systems     Objective:   Physical Exam  General-in no acute distress Eyes-no discharge Lungs-respiratory rate normal, CTA CV-no murmurs,RRR Extremities skin warm dry no edema Neuro grossly normal Behavior normal, alert Eardrums normal throat is normal sinus nontender      Assessment & Plan:   1. Sinus pressure (Primary) Allergy medicine Flonase spray no sign of infection  2. Elevated blood pressure reading Blood pressure checked several times moderately elevated patient relates she she can do a better job with salt intake therefore she will cut back salty foods and do a better job of healthy eating  3. Frequent headaches She will send [place] headache update next week if her headaches persist and blood pressure stays high we will need to initiate medications No red flags currently but if she starts having headaches that causes nausea or vomiting severe pain in the middle of the night we will need to do a scan  Inner ear-no sign of nystagmus on today's visit.  If this becomes problematic we will need possibly ENT consult as well as physical therapy for vestibular training  Patient to follow-up in 30 days and send [place] from earlier today thank you  Lab work ordered

## 2023-11-09 NOTE — Progress Notes (Signed)
 Marland Kitchen

## 2023-11-09 NOTE — Patient Instructions (Signed)

## 2023-11-12 ENCOUNTER — Encounter (INDEPENDENT_AMBULATORY_CARE_PROVIDER_SITE_OTHER): Payer: Self-pay | Admitting: Gastroenterology

## 2023-11-12 ENCOUNTER — Ambulatory Visit (INDEPENDENT_AMBULATORY_CARE_PROVIDER_SITE_OTHER): Admitting: Gastroenterology

## 2023-11-12 VITALS — BP 134/70 | HR 77 | Temp 97.5°F | Ht 64.0 in | Wt 121.4 lb

## 2023-11-12 DIAGNOSIS — R030 Elevated blood-pressure reading, without diagnosis of hypertension: Secondary | ICD-10-CM | POA: Diagnosis not present

## 2023-11-12 DIAGNOSIS — K5100 Ulcerative (chronic) pancolitis without complications: Secondary | ICD-10-CM | POA: Diagnosis not present

## 2023-11-12 DIAGNOSIS — Z111 Encounter for screening for respiratory tuberculosis: Secondary | ICD-10-CM

## 2023-11-12 NOTE — Patient Instructions (Addendum)
 Will start authorization process for infliximab (Remicade) induction and every 8 week dosing If medication is not authorized or covered, we will attempt authorization process with Entyvio Please continue Humira for now until you start infliximab, need to take last dose of Humira 2 weeks before first dose of Remicade Perform blood workup

## 2023-11-12 NOTE — Progress Notes (Signed)
 Katrinka Blazing, M.D. Gastroenterology & Hepatology [facility] [address] [place], [place] [address]  Primary Care Physician: Babs Sciara, MD [address] [place] [address]  I will communicate my assessment and recommendations to the referring MD via EMR.  Problems: Ulcerative pancolitis in remission   History of Present Illness: Yvonne [name] is a 63 y.o. female with past medical history of ulcerative pancolitis, hyperlipidemia, osteoporosis, who presents for follow up of ulcerative colitis.  The patient was last seen on 05/24/2023. At that time, the patient was continued on Hyrimoz 40 mg every 2 weeks.  Patient is here today as she wants to discuss other treatment options as she has to pay $669.59 cents per month for her Hymrioz, which is a significant amount of money.  Patient denies any complaints and states feeling well. Has a BM every day. The patient denies having any nausea, vomiting, fever, chills, hematochezia, melena, hematemesis, abdominal distention, abdominal pain, diarrhea, jaundice, pruritus or weight loss.  Last flu shot:October 2024  Last pneumonia shot: 2017 Last Pap smear: advised to stop doing these by PCP Mammogram: done in 2024  Last evaluation by dermatology: July 2024 Last zoster vaccine: 2022 Last DEXA 04/2022 -consistent with osteoporosis COVID-19 shot: Moderna x2  Last Colonoscopy November 2023. Colonoscopy showed inactive disease with Mayo score of 0. Random colonic biopsies did not show any active disease but only chronic changes. Internal hemorrhoids.   Advised to repeat colonoscopy in 3 years  Past Medical History: Past Medical History:  Diagnosis Date   Anemia    HLD (hyperlipidemia)    Osteoporosis    Ulcerative colitis     Past Surgical History: Past Surgical History:  Procedure Laterality Date   BIOPSY  08/03/2019   Procedure: BIOPSY;  Surgeon: Malissa Hippo, MD;   [facility];  Service: Endoscopy;;   BIOPSY  06/19/2022   Procedure: BIOPSY;  Surgeon: Dolores Frame, MD;  [facility]: [facility];  Service: Gastroenterology;;   COLONOSCOPY N/A 08/03/2019   Procedure: COLONOSCOPY;  Surgeon: Malissa Hippo, MD;  [facility];  Service: Endoscopy;  Laterality: N/A;  930   COLONOSCOPY WITH PROPOFOL N/A 06/19/2022   Procedure: COLONOSCOPY WITH PROPOFOL;  Surgeon: Dolores Frame, MD;  [facility]: [facility];  Service: Gastroenterology;  Laterality: N/A;  1115 ASA 2   PARTIAL HYSTERECTOMY      Family History: Family History  Problem Relation Age of Onset   Colon cancer Neg Hx    Diabetes Neg Hx    Heart disease Neg Hx     Social History: Social History   Tobacco Use  Smoking Status Never   Passive exposure: Never  Smokeless Tobacco Never   Social History   Substance and Sexual Activity  Alcohol Use No   Alcohol/week: 0.0 standard drinks of alcohol   Social History   Substance and Sexual Activity  Drug Use No    Allergies: No Known Allergies  Medications: Current Outpatient Medications  Medication Sig Dispense Refill   Biotin w/ Vitamins C & E (HAIR/SKIN/NAILS PO) Take 5,000 mcg by mouth 2 (two) times daily.     Calcium Carb-Cholecalciferol (CALCIUM + VITAMIN D3 PO) Take by mouth 2 (two) times daily. Calicum 400 mg, Vitamin D3 500 IU     cholecalciferol (VITAMIN D3) 25 MCG (1000 UNIT) tablet Take 1,000 Units by mouth 2 (two) times daily. This is a gel capsule.     denosumab (PROLIA)  60 MG/ML SOSY injection Inject 60 mg into the skin every 6 (six) months.     ezetimibe (ZETIA) 10 MG tablet Take 1 tablet (10 mg total) by mouth daily. 30 tablet 12   fluticasone (FLONASE) 50 MCG/ACT nasal spray Place 2 sprays into both nostrils daily. 16 g 12   HYRIMOZ 40 MG/0.4ML SOAJ INJECT 1 PEN UNDER THE SKIN EVERY 14 DAYS 0.8 mL 11   Krill Oil 1000 MG CAPS Take 1 capsule (1,000 mg total) by mouth 2 (two)  times daily. (Patient taking differently: Take 1,000 mg by mouth 2 (two) times daily.)     rosuvastatin (CRESTOR) 10 MG tablet Take 1 tablet po once daily 30 tablet 12   No current facility-administered medications for this visit.    Review of Systems: GENERAL: negative for malaise, night sweats HEENT: No changes in hearing or vision, no nose bleeds or other nasal problems. NECK: Negative for lumps, goiter, pain and significant neck swelling RESPIRATORY: Negative for cough, wheezing CARDIOVASCULAR: Negative for chest pain, leg swelling, palpitations, orthopnea GI: SEE HPI MUSCULOSKELETAL: Negative for joint pain or swelling, back pain, and muscle pain. SKIN: Negative for lesions, rash PSYCH: Negative for sleep disturbance, mood disorder and recent psychosocial stressors. HEMATOLOGY Negative for prolonged bleeding, bruising easily, and swollen nodes. ENDOCRINE: Negative for cold or heat intolerance, polyuria, polydipsia and goiter. NEURO: negative for tremor, gait imbalance, syncope and seizures. The remainder of the review of systems is noncontributory.   Physical Exam: BP 134/70 (BP Location: Left Arm, Patient Position: Sitting, Cuff Size: Normal)   Pulse 77   Temp (!) 97.5 F (36.4 C) (Temporal)   Ht 5\' 4"  (1.626 m)   Wt 121 lb 6.4 oz (55.1 kg)   BMI 20.84 kg/m  GENERAL: The patient is AO x3, in no acute distress. HEENT: Head is normocephalic and atraumatic. EOMI are intact. Mouth is well hydrated and without lesions. NECK: Supple. No masses LUNGS: Clear to auscultation. No presence of rhonchi/wheezing/rales. Adequate chest expansion HEART: RRR, normal s1 and s2. ABDOMEN: Soft, nontender, no guarding, no peritoneal signs, and nondistended. BS +. No masses. EXTREMITIES: Without any cyanosis, clubbing, rash, lesions or edema. NEUROLOGIC: AOx3, no focal motor deficit. SKIN: no jaundice, no rashes  Imaging/Labs: as above  I personally reviewed and interpreted the available  labs, imaging and endoscopic files.  Impression and Plan: Yvonne [name] is a 64 y.o. female with past medical history of ulcerative pancolitis, hyperlipidemia, osteoporosis, who presents for follow up of ulcerative colitis.  The patient has had a history of ulcerative pancolitis for multiple years, which fortunately has responded to the use of adalimumab as she has achieved both clinical and endoscopic remission with very minimal histologic activity.  She was initially on Humira but had to switch to Hymrioz due to insurance coverage.  Unfortunately, even though part of the medication is covered, it is still too costly which brings significant financial burden that the patient cannot take.  We discussed in detail the multiple options that are available for management of ulcerative colitis and the need to continue this medication to prevent flares, hospitalization, need for surgery and colorectal cancer development if uncontrolled.  Patient understood about the importance of continuing advanced medical therapies for ulcerative colitis. Yvonne [name] will require treatment with steroid-sparing medications. Discussion was held regarding benefits and side effects of advanced medical therapies (including infections, malignancies such as lymphoma, skin cancer, tolerance to medication), as well as need to continue control her disease clinically and endoscopically (ideally microscopically  as well) to avoid recurrence of her symptomatology.  As the patient has responded to a TNF inhibitor in the past, I advised the patient that this will be her best choice.  She is open to try infliximab as long as this is covered by her insurance and she has to pay a lower co-pay for the doses.  If she eventually considers that the subcutaneous choice is better, we may consider switching from IV to subcutaneous infliximab in the future but not for now.  We will start authorization process for loading and maintenance of infliximab 5  mg/kg every 8 weeks.  As a backup plan, we may prescribe Entyvio if infliximab is not covered by her insurance or if it is too expensive.  We will obtain surveillance labs today.  Patient is up-to-date regarding her preventative measures.  -Will start authorization process for infliximab (Remicade) induction and every 8 week dosing -If medication is not authorized or covered, we will attempt authorization process with Entyvio -Patient should continue Humira for now until she start infliximab, need to take last dose of Humira 2 weeks before first dose of Remicade -Check CBC, CMP, QuantiFERON  All questions were answered.      Katrinka Blazing, MD Gastroenterology and Hepatology [facility]

## 2023-11-13 LAB — MICROALBUMIN / CREATININE URINE RATIO
Creatinine, Urine: 60.3 mg/dL
Microalb/Creat Ratio: 9 mg/g{creat} (ref 0–29)
Microalbumin, Urine: 5.3 ug/mL

## 2023-11-13 LAB — BASIC METABOLIC PANEL WITH GFR
BUN/Creatinine Ratio: 20 (ref 12–28)
BUN: 16 mg/dL (ref 8–27)
CO2: 23 mmol/L (ref 20–29)
Calcium: 9.9 mg/dL (ref 8.7–10.3)
Chloride: 101 mmol/L (ref 96–106)
Creatinine, Ser: 0.8 mg/dL (ref 0.57–1.00)
Glucose: 88 mg/dL (ref 70–99)
Potassium: 4.2 mmol/L (ref 3.5–5.2)
Sodium: 140 mmol/L (ref 134–144)
eGFR: 84 mL/min/{1.73_m2} (ref >59)

## 2023-11-13 LAB — TSH+FREE T4
Free T4: 1.27 ng/dL (ref 0.82–1.77)
TSH: 1.75 u[IU]/mL (ref 0.450–4.500)

## 2023-11-15 ENCOUNTER — Other Ambulatory Visit (INDEPENDENT_AMBULATORY_CARE_PROVIDER_SITE_OTHER): Payer: Self-pay | Admitting: Gastroenterology

## 2023-11-15 ENCOUNTER — Encounter: Payer: Self-pay | Admitting: Family Medicine

## 2023-11-15 ENCOUNTER — Telehealth: Payer: Self-pay

## 2023-11-15 DIAGNOSIS — K5100 Ulcerative (chronic) pancolitis without complications: Secondary | ICD-10-CM | POA: Diagnosis not present

## 2023-11-15 DIAGNOSIS — Z111 Encounter for screening for respiratory tuberculosis: Secondary | ICD-10-CM | POA: Diagnosis not present

## 2023-11-15 NOTE — Telephone Encounter (Addendum)
 Hello,  Patient will be scheduled as soon as possible.  Auth Submission: APPROVED Site of care: Site of care: [facility] Payer: [employer] Medication & CPT/J Code(s) submitted: Avsola (infliximab-axxq) [id number] Route of submission (phone, fax, portal): portal Phone # Fax # Auth type: Buy/Bill PB Units/visits requested: 5mg /kg, q8weeks, 5 doses Reference number: [id number] Approval from: 11/15/23 to 10/3024   [employer] Card  Card# [id number] Group# [id number] [id number] CVC# [id number] PCN#[id number] EXP: 06/16/26

## 2023-11-15 NOTE — Telephone Encounter (Signed)
 Thanks, patient may have some questions regarding costs of the medication.

## 2023-11-17 ENCOUNTER — Encounter (INDEPENDENT_AMBULATORY_CARE_PROVIDER_SITE_OTHER): Payer: Self-pay

## 2023-11-17 LAB — CBC WITH DIFFERENTIAL/PLATELET
Absolute Lymphocytes: 1823 {cells}/uL (ref 850–3900)
Absolute Monocytes: 456 {cells}/uL (ref 200–950)
Basophils Absolute: 49 {cells}/uL (ref 0–200)
Basophils Relative: 1 %
Eosinophils Absolute: 20 {cells}/uL (ref 15–500)
Eosinophils Relative: 0.4 %
HCT: 39.6 % (ref 35.0–45.0)
Hemoglobin: 12.8 g/dL (ref 11.7–15.5)
MCH: 29.9 pg (ref 27.0–33.0)
MCHC: 32.3 g/dL (ref 32.0–36.0)
MCV: 92.5 fL (ref 80.0–100.0)
MPV: 12.3 fL (ref 7.5–12.5)
Monocytes Relative: 9.3 %
Neutro Abs: 2553 {cells}/uL (ref 1500–7800)
Neutrophils Relative %: 52.1 %
Platelets: 242 10*3/uL (ref 140–400)
RBC: 4.28 10*6/uL (ref 3.80–5.10)
RDW: 12.2 % (ref 11.0–15.0)
Total Lymphocyte: 37.2 %
WBC: 4.9 10*3/uL (ref 3.8–10.8)

## 2023-11-17 LAB — QUANTIFERON-TB GOLD PLUS
Mitogen-NIL: 6.84 [IU]/mL
NIL: 0.05 [IU]/mL
QuantiFERON-TB Gold Plus: NEGATIVE
TB1-NIL: 0 [IU]/mL
TB2-NIL: 0 [IU]/mL

## 2023-11-17 LAB — COMPREHENSIVE METABOLIC PANEL WITH GFR
AG Ratio: 1.8 (calc) (ref 1.0–2.5)
ALT: 17 U/L (ref 6–29)
AST: 18 U/L (ref 10–35)
Albumin: 5.1 g/dL (ref 3.6–5.1)
Alkaline phosphatase (APISO): 37 U/L (ref 37–153)
BUN: 17 mg/dL (ref 7–25)
CO2: 29 mmol/L (ref 20–32)
Calcium: 10 mg/dL (ref 8.6–10.4)
Chloride: 103 mmol/L (ref 98–110)
Creat: 0.72 mg/dL (ref 0.50–1.05)
Globulin: 2.8 g/dL (ref 1.9–3.7)
Glucose, Bld: 88 mg/dL (ref 65–99)
Potassium: 4.7 mmol/L (ref 3.5–5.3)
Sodium: 138 mmol/L (ref 135–146)
Total Bilirubin: 0.5 mg/dL (ref 0.2–1.2)
Total Protein: 7.9 g/dL (ref 6.1–8.1)
eGFR: 95 mL/min/{1.73_m2} (ref >=60)

## 2023-11-30 ENCOUNTER — Ambulatory Visit (INDEPENDENT_AMBULATORY_CARE_PROVIDER_SITE_OTHER): Payer: 59 | Admitting: Gastroenterology

## 2023-11-30 ENCOUNTER — Encounter: Attending: Gastroenterology | Admitting: *Deleted

## 2023-11-30 VITALS — BP 142/67 | HR 76 | Temp 98.5°F | Resp 16 | Ht 64.0 in | Wt 121.0 lb

## 2023-11-30 DIAGNOSIS — K5100 Ulcerative (chronic) pancolitis without complications: Secondary | ICD-10-CM | POA: Diagnosis not present

## 2023-11-30 MED ORDER — SODIUM CHLORIDE 0.9 % IV SOLN
5.0000 mg/kg | Freq: Once | INTRAVENOUS | Status: AC
  Administered 2023-11-30: 300 mg via INTRAVENOUS
  Filled 2023-11-30: qty 30

## 2023-11-30 MED ORDER — DIPHENHYDRAMINE HCL 25 MG PO CAPS
25.0000 mg | ORAL_CAPSULE | Freq: Once | ORAL | Status: AC
  Administered 2023-11-30: 25 mg via ORAL

## 2023-11-30 MED ORDER — METHYLPREDNISOLONE SODIUM SUCC 40 MG IJ SOLR
40.0000 mg | Freq: Once | INTRAMUSCULAR | Status: AC
  Administered 2023-11-30: 40 mg via INTRAVENOUS

## 2023-11-30 MED ORDER — ACETAMINOPHEN 325 MG PO TABS
650.0000 mg | ORAL_TABLET | Freq: Once | ORAL | Status: AC
Start: 2023-11-30 — End: 2023-11-30
  Administered 2023-11-30: 650 mg via ORAL

## 2023-11-30 NOTE — Progress Notes (Signed)
 Diagnosis: Ulcerative Colitis  Provider:  Samantha Cress MD  Procedure: IV Infusion  IV Type: Peripheral, IV Location: R Antecubital  Avsola (infliximab-axxq), Dose: 300 mg  Infusion Start Time: 1119  Infusion Stop Time: 1335  Post Infusion IV Care: Observation period completed  Discharge: Condition: Good, Destination: Home . AVS Provided  Performed by:  Mutasim Tuckey Ragsdale, RN

## 2023-12-07 ENCOUNTER — Ambulatory Visit: Admitting: Family Medicine

## 2023-12-07 ENCOUNTER — Encounter: Payer: Self-pay | Admitting: Family Medicine

## 2023-12-07 VITALS — BP 124/79 | HR 73 | Temp 98.8°F | Ht 64.0 in | Wt 120.4 lb

## 2023-12-07 DIAGNOSIS — K5100 Ulcerative (chronic) pancolitis without complications: Secondary | ICD-10-CM

## 2023-12-07 DIAGNOSIS — E7849 Other hyperlipidemia: Secondary | ICD-10-CM | POA: Diagnosis not present

## 2023-12-07 NOTE — Progress Notes (Signed)
   Subjective:    Patient ID: Yvonne [name], female    DOB: 1960/12/04, 63 y.o.   MRN: [id number]  HPI Pt comes in today for follow up visit for blood pressure. Pt states she checks her bp regularly and it has leaved out mostly with some spikes here or there. Medications and allergies reviewed.   Patient doing a good job with healthy eating Cutting back on salt Checks her blood pressure most of the readings look good She send us  message previously regarding several readings She does use a wrist cuff We did discuss getting a bicep cuff Review of Systems     Objective:   Physical Exam  Bno General-in no acute distress Eyes-no discharge Lungs-respiratory rate normal, CTA CV-no murmurs,RRR Extremities skin warm dry no edema Neuro grossly normal Behavior normal, alert   Blood pressure was checked with wall unit as well as cuff.  Readings are little higher than what she gets at home but still within an acceptable range    Assessment & Plan:  I have urged her to get a bicep Omron series 3 or 5  This readings periodically  Can continue healthy diet  Continue medications for cholesterol  Follow-up late fall follow-up sooner if any problems  Patient recently was put on the new medication for her ulcerative colitis so far she is tolerating it Medicine very similar to the Humira  that she was on-the switch was due to insurance coverage issues

## 2023-12-14 ENCOUNTER — Encounter (INDEPENDENT_AMBULATORY_CARE_PROVIDER_SITE_OTHER): Admitting: *Deleted

## 2023-12-14 VITALS — BP 133/78 | HR 68 | Temp 97.8°F | Resp 18

## 2023-12-14 DIAGNOSIS — K5100 Ulcerative (chronic) pancolitis without complications: Secondary | ICD-10-CM | POA: Diagnosis not present

## 2023-12-14 MED ORDER — ACETAMINOPHEN 325 MG PO TABS
650.0000 mg | ORAL_TABLET | Freq: Once | ORAL | Status: AC
  Administered 2023-12-14: 650 mg via ORAL

## 2023-12-14 MED ORDER — METHYLPREDNISOLONE SODIUM SUCC 40 MG IJ SOLR
40.0000 mg | Freq: Once | INTRAMUSCULAR | Status: AC
  Administered 2023-12-14: 40 mg via INTRAVENOUS

## 2023-12-14 MED ORDER — SODIUM CHLORIDE 0.9 % IV SOLN
5.0000 mg/kg | Freq: Once | INTRAVENOUS | Status: AC
  Administered 2023-12-14: 300 mg via INTRAVENOUS
  Filled 2023-12-14 (×4): qty 30

## 2023-12-14 MED ORDER — DIPHENHYDRAMINE HCL 25 MG PO CAPS
25.0000 mg | ORAL_CAPSULE | Freq: Once | ORAL | Status: AC
  Administered 2023-12-14: 25 mg via ORAL

## 2023-12-14 NOTE — Progress Notes (Signed)
 Diagnosis: Ulcerative Colitis  Provider:  Samantha Cress MD  Procedure: IV Infusion  IV Type: Peripheral, IV Location: R Antecubital  Avsola  (infliximab -axxq), Dose: 300 mg  Infusion Start Time: 1007  Infusion Stop Time: 1225  Post Infusion IV Care: Observation period completed and Peripheral IV Discontinued  Discharge: Condition: Good, Destination: Home . AVS Provided  Performed by:  Larwence Tu Ragsdale, RN

## 2024-01-11 ENCOUNTER — Encounter: Attending: Gastroenterology | Admitting: Emergency Medicine

## 2024-01-11 VITALS — BP 148/92 | HR 79 | Temp 98.1°F | Resp 16

## 2024-01-11 DIAGNOSIS — K5100 Ulcerative (chronic) pancolitis without complications: Secondary | ICD-10-CM

## 2024-01-11 MED ORDER — ACETAMINOPHEN 325 MG PO TABS
650.0000 mg | ORAL_TABLET | Freq: Once | ORAL | Status: AC
  Administered 2024-01-11: 650 mg via ORAL

## 2024-01-11 MED ORDER — DIPHENHYDRAMINE HCL 25 MG PO CAPS
25.0000 mg | ORAL_CAPSULE | Freq: Once | ORAL | Status: AC
  Administered 2024-01-11: 25 mg via ORAL

## 2024-01-11 MED ORDER — METHYLPREDNISOLONE SODIUM SUCC 40 MG IJ SOLR
40.0000 mg | Freq: Once | INTRAMUSCULAR | Status: AC
  Administered 2024-01-11: 40 mg via INTRAVENOUS

## 2024-01-11 MED ORDER — SODIUM CHLORIDE 0.9 % IV SOLN
300.0000 mg | Freq: Once | INTRAVENOUS | Status: AC
  Administered 2024-01-11: 300 mg via INTRAVENOUS
  Filled 2024-01-11: qty 30

## 2024-01-11 NOTE — Progress Notes (Signed)
 Diagnosis: Ulcerative Colitis  Provider:  Samantha Cress MD  Procedure: IV Infusion  IV Type: Peripheral, IV Location: R Antecubital  Avsola  (infliximab -axxq), Dose: 300 mg  Infusion Start Time: 0934  Infusion Stop Time: 1145  Post Infusion IV Care: Peripheral IV Discontinued  Discharge: Condition: Good, Destination: Home . AVS Provided  Performed by:  Arlina Benjamin, RN

## 2024-02-08 ENCOUNTER — Ambulatory Visit (INDEPENDENT_AMBULATORY_CARE_PROVIDER_SITE_OTHER): Admitting: Gastroenterology

## 2024-02-29 ENCOUNTER — Ambulatory Visit (INDEPENDENT_AMBULATORY_CARE_PROVIDER_SITE_OTHER): Admitting: Gastroenterology

## 2024-02-29 ENCOUNTER — Encounter (INDEPENDENT_AMBULATORY_CARE_PROVIDER_SITE_OTHER): Payer: Self-pay | Admitting: Gastroenterology

## 2024-02-29 VITALS — BP 126/65 | HR 76 | Temp 97.5°F | Wt 121.8 lb

## 2024-02-29 DIAGNOSIS — Z79620 Long term (current) use of immunosuppressive biologic: Secondary | ICD-10-CM

## 2024-02-29 DIAGNOSIS — Z1159 Encounter for screening for other viral diseases: Secondary | ICD-10-CM

## 2024-02-29 DIAGNOSIS — K5100 Ulcerative (chronic) pancolitis without complications: Secondary | ICD-10-CM | POA: Diagnosis not present

## 2024-02-29 NOTE — Patient Instructions (Signed)
-  Continue Avsola  every 8 weeks (please let us  know if there are any issues with coverage of this when you obtain your new insurance) -continue good water intake, aim for atleast 64 oz per day -Increase fruits, veggies and whole grains, kiwi and prunes are especially good for constipation -continue fiber as needed for constipation  -make me aware of any new GI issues  Follow up 6 months  It was a pleasure to see you today. I want to create trusting relationships with patients and provide genuine, compassionate, and quality care. I truly value your feedback! please be on the lookout for a survey regarding your visit with me today. I appreciate your input about our visit and your time in completing this!    Ambermarie Honeyman L. Locklyn Henriquez, MSN, APRN, AGNP-C Adult-Gerontology Nurse Practitioner [facility] at [facility]

## 2024-02-29 NOTE — Progress Notes (Signed)
 Referring Provider: Alphonsa Glendia LABOR, MD Primary Care Physician:  Alphonsa Glendia LABOR, MD Primary GI Physician: Dr. Eartha   Chief Complaint  Patient presents with   Follow-up    Patient here today for a follow up on UC. Says she is doing well on Avsola  Q 8 weeks. She says she some time has issues with constipation. She usually will take fiber and this corrects the situation.    HPI:   Yvonne [name] is a 63 y.o. female with past medical history of ulcerative pancolitis, hyperlipidemia, osteoporosis  Patient presenting today for:  Follow up of ulcerative pancolitis  Last seen march 2025, at that time by Dr. Eartha. Discussion of treatment options at that ime as her Hyrimoz  was too costly OOP. Feeling well and having a BM daily.   Recommended CBC, CMP, TB quant, start authorization for Remicade  every 8 weeks, consider Entyvio if not covered, continue Humira  until 2 weeks prior to start of new therapy.  CBC, CMP and TB quant done 3/31 were all WNL/negative. Last Hep B testing was October 2024--negative   Patient had first infusion of Avsola  on 4/15 with subsequent infusions on 4/29, 5/27   Present: Has had 3 infusions of Avsola , has upcoming first maintenance dose next week. She is having a BM once daily. Occasionally has some constipation depending on what she eats, will take some fiber when this occurs and this helps. She does a couple of metamuciil capsules, maybe a few times per month which seems to help. Denies any abdominal pain, rectal bleeding or melena. Does a lot of smoothies, and good water intake.    Last flu shot:October 2024  Last pneumonia shot: 2017 Last Pap smear: advised to stop doing these by PCP Mammogram: done in 2024  Last evaluation by dermatology: has appt on 02/2028, [facility]  Last zoster vaccine: 2022 Last DEXA 04/2022 -consistent with osteoporosis COVID-19 shot: Moderna x2  Has appt in September for mammogram and DEXA *   Last Colonoscopy  November 2023. Colonoscopy showed inactive disease with Mayo score of 0. Random colonic biopsies did not show any active disease but only chronic changes. Internal hemorrhoids.    Advised to repeat colonoscopy in 3 years  Filed Weights   02/29/24 0947  Weight: 121 lb 12.8 oz (55.2 kg)     Past Medical History:  Diagnosis Date   Anemia    HLD (hyperlipidemia)    Osteoporosis    Ulcerative colitis     Past Surgical History:  Procedure Laterality Date   BIOPSY  08/03/2019   Procedure: BIOPSY;  Surgeon: Golda Claudis PENNER, MD;  [facility];  Service: Endoscopy;;   BIOPSY  06/19/2022   Procedure: BIOPSY;  Surgeon: Eartha Angelia Sieving, MD;  [facility];  Service: Gastroenterology;;   COLONOSCOPY N/A 08/03/2019   Procedure: COLONOSCOPY;  Surgeon: Golda Claudis PENNER, MD;  [facility];  Service: Endoscopy;  Laterality: N/A;  930   COLONOSCOPY WITH PROPOFOL  N/A 06/19/2022   Procedure: COLONOSCOPY WITH PROPOFOL ;  Surgeon: Eartha Angelia Sieving, MD;  [facility];  Service: Gastroenterology;  Laterality: N/A;  1115 ASA 2   PARTIAL HYSTERECTOMY      Current Outpatient Medications  Medication Sig Dispense Refill   Biotin w/ Vitamins C & E (HAIR/SKIN/NAILS PO) Take 5,000 mcg by mouth 2 (two) times daily.     Calcium  Carb-Cholecalciferol (CALCIUM  + VITAMIN D3 PO) Take by mouth 2 (two) times daily. Calicum 400 mg, Vitamin D3  500 IU     cholecalciferol (VITAMIN D3) 25 MCG (1000 UNIT) tablet Take 1,000 Units by mouth 2 (two) times daily. This is a gel capsule.     denosumab  (PROLIA ) 60 MG/ML SOSY injection Inject 60 mg into the skin every 6 (six) months.     ezetimibe  (ZETIA ) 10 MG tablet Take 1 tablet (10 mg total) by mouth daily. 30 tablet 12   fluticasone  (FLONASE ) 50 MCG/ACT nasal spray Place 2 sprays into both nostrils daily. (Patient taking differently: Place 2 sprays into both nostrils as needed. Spring) 16 g 12   inFLIXimab -axxq (AVSOLA   IV) Inject 5 mg into the vein. (Patient taking differently: Inject 5 mg into the vein. Every 8 weeks at [facility].)     Krill Oil 1000 MG CAPS Take 1 capsule (1,000 mg total) by mouth 2 (two) times daily.     rosuvastatin  (CRESTOR ) 10 MG tablet Take 1 tablet po once daily 30 tablet 12   No current facility-administered medications for this visit.    Allergies as of 02/29/2024   (No Known Allergies)    Social History   Socioeconomic History   Marital status: Married    Spouse name: Not on file   Number of children: 2   Years of education: Not on file   Highest education level: Some college, no degree  Occupational History   Occupation: cleaning bussiness-self employed    Employer: NOT EMPLOYED  Tobacco Use   Smoking status: Never    Passive exposure: Never   Smokeless tobacco: Never  Vaping Use   Vaping status: Never Used  Substance and Sexual Activity   Alcohol use: No    Alcohol/week: 0.0 standard drinks of alcohol   Drug use: No   Sexual activity: Not on file  Other Topics Concern   Not on file  Social History Narrative   Not on file   Social Drivers of Health   Financial Resource Strain: Low Risk  (11/09/2023)   Overall Financial Resource Strain (CARDIA)    Difficulty of Paying Living Expenses: Not hard at all  Food Insecurity: No Food Insecurity (11/09/2023)   Hunger Vital Sign    Worried About Running Out of Food in the Last Year: Never true    Ran Out of Food in the Last Year: Never true  Transportation Needs: No Transportation Needs (11/09/2023)   PRAPARE - Administrator, Civil Service (Medical): No    Lack of Transportation (Non-Medical): No  Physical Activity: Sufficiently Active (11/09/2023)   Exercise Vital Sign    Days of Exercise per Week: 3 days    Minutes of Exercise per Session: 150+ min  Stress: No Stress Concern Present (11/09/2023)   [employer] of Occupational Health - Occupational Stress Questionnaire    Feeling of  Stress : Not at all  Social Connections: Socially Integrated (11/09/2023)   Social Connection and Isolation Panel    Frequency of Communication with Friends and Family: Three times a week    Frequency of Social Gatherings with Friends and Family: Twice a week    Attends Religious Services: 1 to 4 times per year    Active Member of [employer] or Organizations: Yes    Attends Banker Meetings: 1 to 4 times per year    Marital Status: Married    Review of systems General: negative for malaise, night sweats, fever, chills, weight loss Neck: Negative for lumps, goiter, pain and significant neck swelling Resp: Negative for cough, wheezing,  dyspnea at rest CV: Negative for chest pain, leg swelling, palpitations, orthopnea GI: denies melena, hematochezia, nausea, vomiting, diarrhea, dysphagia, odyonophagia, early satiety or unintentional weight loss. +occasional constipation  MSK: Negative for joint pain or swelling, back pain, and muscle pain. Derm: Negative for itching or rash Psych: Denies depression, anxiety, memory loss, confusion. No homicidal or suicidal ideation.  Heme: Negative for prolonged bleeding, bruising easily, and swollen nodes. Endocrine: Negative for cold or heat intolerance, polyuria, polydipsia and goiter. Neuro: negative for tremor, gait imbalance, syncope and seizures. The remainder of the review of systems is noncontributory.  Physical Exam: BP 126/65 (BP Location: Left Arm, Patient Position: Sitting, Cuff Size: Normal)   Pulse 76   Temp (!) 97.5 F (36.4 C) (Temporal)   Wt 121 lb 12.8 oz (55.2 kg)   BMI 20.91 kg/m  General:   Alert and oriented. No distress noted. Pleasant and cooperative.  Head:  Normocephalic and atraumatic. Eyes:  Conjuctiva clear without scleral icterus. Mouth:  Oral mucosa pink and moist. Good dentition. No lesions. Heart: Normal rate and rhythm, s1 and s2 heart sounds present.  Lungs: Clear lung sounds in all lobes. Respirations  equal and unlabored. Abdomen:  +BS, soft, non-tender and non-distended. No rebound or guarding. No HSM or masses noted. Derm: No palmar erythema or jaundice Msk:  Symmetrical without gross deformities. Normal posture. Extremities:  Without edema. Neurologic:  Alert and  oriented x4 Psych:  Alert and cooperative. Normal mood and affect.  Invalid input(s): 6 MONTHS   ASSESSMENT: Yvonne [name] is a 63 y.o. female presenting today for follow up of ulcerative pancolitis, in remission  Patient with multi year history of ulcerative pancolitis, had previously responded well to use of Adalimumab  and achieved both clinical and endoscopic remission, however her insurance coverage changed and caused significant OOP cost for her for her Adalimumab , therefore she was switched to Avsola  q8W in April. She has received 3 induction doses with first upcoming maintenance dose next week. Doing well on this, with one solid BM per day without abdominal pain or rectal bleeding. She notes occasional constipation for which she takes metamucil with resolution, maybe a couple of times per month. Otherwise she has no GI complaints today. She had recent routine labs in march that were WNL, along with TB testing that was negative, Hep BsAg in oct 2024 was negative, due again this October, will place in reminder folder for this. She is up to date on preventative measures with upcoming DEXA and mammogram in September. She does tell me her current [employer] is dropping coverage and they will have to find a new coverage provider by January. I advised her to let me know if there is a change in coverage of her Avsola  with this change in insurance.    PLAN:  -Continue Avsola  every 8 weeks -repeat TCS 2026 -Increase water intake, aim for atleast 64 oz per day -Increase fruits, veggies and whole grains, kiwi and prunes are especially good for constipation -continue fiber PRN for constipation  -up to date on TB testing until  March 2026, Hep B testing until Oct 2025 (reminder folder) -repeat routine labs at follow up -pt to make me aware of any changes in coverage of Avsola  with new insurance  All questions were answered, patient verbalized understanding and is in agreement with plan as outlined above.    Follow Up: 6 months   Delinda Malan L. Sota Hetz, MSN, APRN, AGNP-C Adult-Gerontology Nurse Practitioner [facility]  I have  reviewed the note and agree with the APP's assessment as described in this progress note  Toribio Fortune, MD Gastroenterology and Hepatology [facility]

## 2024-03-07 ENCOUNTER — Encounter: Attending: Gastroenterology | Admitting: Internal Medicine

## 2024-03-07 VITALS — BP 117/67 | HR 63 | Temp 97.8°F | Resp 16

## 2024-03-07 DIAGNOSIS — K5100 Ulcerative (chronic) pancolitis without complications: Secondary | ICD-10-CM | POA: Insufficient documentation

## 2024-03-07 MED ORDER — SODIUM CHLORIDE 0.9 % IV SOLN
5.0000 mg/kg | Freq: Once | INTRAVENOUS | Status: AC
  Administered 2024-03-07: 300 mg via INTRAVENOUS
  Filled 2024-03-07: qty 30

## 2024-03-07 MED ORDER — ACETAMINOPHEN 325 MG PO TABS
650.0000 mg | ORAL_TABLET | Freq: Once | ORAL | Status: AC
  Administered 2024-03-07: 650 mg via ORAL

## 2024-03-07 MED ORDER — METHYLPREDNISOLONE SODIUM SUCC 40 MG IJ SOLR
40.0000 mg | Freq: Once | INTRAMUSCULAR | Status: AC
  Administered 2024-03-07: 40 mg via INTRAVENOUS

## 2024-03-07 MED ORDER — DIPHENHYDRAMINE HCL 25 MG PO CAPS
25.0000 mg | ORAL_CAPSULE | Freq: Once | ORAL | Status: AC
Start: 2024-03-07 — End: 2024-03-07
  Administered 2024-03-07: 25 mg via ORAL

## 2024-03-07 NOTE — Progress Notes (Signed)
 Diagnosis: Ulcerative Colitis  Provider:  Eartha Sieving MD  Procedure: IV Infusion  IV Type: Peripheral, IV Location: R Antecubital  Avsola  (infliximab -axxq), Dose: 300 mg  Infusion Start Time: 0954  Infusion Stop Time: 1202  Post Infusion IV Care: Observation period completed  Discharge: Condition: Good, Destination: Home . AVS Provided  Performed by:  Blanca Selinda SAUNDERS, LPN

## 2024-03-08 ENCOUNTER — Other Ambulatory Visit: Payer: Self-pay | Admitting: Gastroenterology

## 2024-03-14 DIAGNOSIS — D225 Melanocytic nevi of trunk: Secondary | ICD-10-CM | POA: Diagnosis not present

## 2024-03-14 DIAGNOSIS — L821 Other seborrheic keratosis: Secondary | ICD-10-CM | POA: Diagnosis not present

## 2024-03-14 DIAGNOSIS — Z872 Personal history of diseases of the skin and subcutaneous tissue: Secondary | ICD-10-CM | POA: Diagnosis not present

## 2024-03-14 DIAGNOSIS — L814 Other melanin hyperpigmentation: Secondary | ICD-10-CM | POA: Diagnosis not present

## 2024-04-21 DIAGNOSIS — E782 Mixed hyperlipidemia: Secondary | ICD-10-CM | POA: Diagnosis not present

## 2024-04-21 DIAGNOSIS — M810 Age-related osteoporosis without current pathological fracture: Secondary | ICD-10-CM | POA: Diagnosis not present

## 2024-04-21 DIAGNOSIS — Z8639 Personal history of other endocrine, nutritional and metabolic disease: Secondary | ICD-10-CM | POA: Diagnosis not present

## 2024-05-09 DIAGNOSIS — Z1231 Encounter for screening mammogram for malignant neoplasm of breast: Secondary | ICD-10-CM | POA: Diagnosis not present

## 2024-05-09 DIAGNOSIS — Z6821 Body mass index (BMI) 21.0-21.9, adult: Secondary | ICD-10-CM | POA: Diagnosis not present

## 2024-05-09 DIAGNOSIS — Z13820 Encounter for screening for osteoporosis: Secondary | ICD-10-CM | POA: Diagnosis not present

## 2024-05-09 DIAGNOSIS — Z01419 Encounter for gynecological examination (general) (routine) without abnormal findings: Secondary | ICD-10-CM | POA: Diagnosis not present

## 2024-05-11 ENCOUNTER — Other Ambulatory Visit: Payer: Self-pay | Admitting: Obstetrics and Gynecology

## 2024-05-11 DIAGNOSIS — R928 Other abnormal and inconclusive findings on diagnostic imaging of breast: Secondary | ICD-10-CM

## 2024-05-12 ENCOUNTER — Encounter: Attending: Gastroenterology | Admitting: Emergency Medicine

## 2024-05-12 VITALS — BP 136/78 | HR 67 | Temp 98.3°F | Resp 16 | Wt 121.0 lb

## 2024-05-12 DIAGNOSIS — K5100 Ulcerative (chronic) pancolitis without complications: Secondary | ICD-10-CM | POA: Diagnosis not present

## 2024-05-12 MED ORDER — METHYLPREDNISOLONE SODIUM SUCC 40 MG IJ SOLR
40.0000 mg | Freq: Once | INTRAMUSCULAR | Status: AC
  Administered 2024-05-12: 40 mg via INTRAVENOUS

## 2024-05-12 MED ORDER — DIPHENHYDRAMINE HCL 25 MG PO CAPS
25.0000 mg | ORAL_CAPSULE | Freq: Once | ORAL | Status: AC
  Administered 2024-05-12: 25 mg via ORAL

## 2024-05-12 MED ORDER — ACETAMINOPHEN 325 MG PO TABS
650.0000 mg | ORAL_TABLET | Freq: Once | ORAL | Status: AC
  Administered 2024-05-12: 650 mg via ORAL

## 2024-05-12 MED ORDER — SODIUM CHLORIDE 0.9 % IV SOLN
5.0000 mg/kg | Freq: Once | INTRAVENOUS | Status: AC
  Administered 2024-05-12: 300 mg via INTRAVENOUS
  Filled 2024-05-12: qty 30

## 2024-05-12 NOTE — Progress Notes (Signed)
 Diagnosis: Ulcerative Colitis  Provider:  Eartha Sieving MD  Procedure: IV Infusion  IV Type: Peripheral, IV Location: R Antecubital  Avsola  (infliximab -axxq), Dose: 300 mg  Infusion Start Time: 0937  Infusion Stop Time: 1043  Post Infusion IV Care: Peripheral IV Discontinued  Discharge: Condition: Good, Destination: Home . AVS Provided  Performed by:  Delon ONEIDA Officer, RN

## 2024-05-15 ENCOUNTER — Encounter: Payer: Self-pay | Admitting: Family Medicine

## 2024-05-15 DIAGNOSIS — R030 Elevated blood-pressure reading, without diagnosis of hypertension: Secondary | ICD-10-CM

## 2024-05-15 DIAGNOSIS — E7849 Other hyperlipidemia: Secondary | ICD-10-CM

## 2024-05-15 NOTE — Telephone Encounter (Signed)
 Nurses Please go ahead and order a coronary calcium  scan  (If the test comes back showing minimal or mild levels the treatment of choice is keeping all risk factors including cholesterol under good control, if test results come back showing severe issues cardiology consult)

## 2024-05-15 NOTE — Telephone Encounter (Signed)
 Order placed

## 2024-05-23 ENCOUNTER — Ambulatory Visit
Admission: RE | Admit: 2024-05-23 | Discharge: 2024-05-23 | Disposition: A | Discharge status: Home / Self Care | Source: Ambulatory Visit | Attending: Obstetrics and Gynecology | Admitting: Obstetrics and Gynecology

## 2024-05-23 ENCOUNTER — Ambulatory Visit

## 2024-05-23 DIAGNOSIS — R928 Other abnormal and inconclusive findings on diagnostic imaging of breast: Secondary | ICD-10-CM

## 2024-05-28 ENCOUNTER — Other Ambulatory Visit: Payer: Self-pay | Admitting: Family Medicine

## 2024-05-31 ENCOUNTER — Encounter (INDEPENDENT_AMBULATORY_CARE_PROVIDER_SITE_OTHER): Payer: Self-pay | Admitting: Gastroenterology

## 2024-06-06 ENCOUNTER — Ambulatory Visit (HOSPITAL_COMMUNITY)
Admission: RE | Admit: 2024-06-06 | Discharge: 2024-06-06 | Disposition: A | Discharge status: Home / Self Care | Payer: Self-pay | Source: Ambulatory Visit | Attending: Family Medicine | Admitting: Family Medicine

## 2024-06-06 DIAGNOSIS — R030 Elevated blood-pressure reading, without diagnosis of hypertension: Secondary | ICD-10-CM | POA: Insufficient documentation

## 2024-06-06 DIAGNOSIS — E7849 Other hyperlipidemia: Secondary | ICD-10-CM | POA: Insufficient documentation

## 2024-06-08 ENCOUNTER — Ambulatory Visit: Payer: Self-pay | Admitting: Family Medicine

## 2024-06-08 ENCOUNTER — Telehealth: Payer: Self-pay | Admitting: Family Medicine

## 2024-06-08 NOTE — Telephone Encounter (Signed)
 Nurses-please order metabolic 7, lipid, liver-diagnosis high risk med, hyperlipidemia  Patient is aware to do these labs in November thank you

## 2024-06-09 ENCOUNTER — Other Ambulatory Visit: Payer: Self-pay | Admitting: Family Medicine

## 2024-06-09 ENCOUNTER — Encounter (INDEPENDENT_AMBULATORY_CARE_PROVIDER_SITE_OTHER): Payer: Self-pay

## 2024-06-09 DIAGNOSIS — E7849 Other hyperlipidemia: Secondary | ICD-10-CM

## 2024-06-12 ENCOUNTER — Other Ambulatory Visit: Payer: Self-pay

## 2024-06-12 ENCOUNTER — Other Ambulatory Visit (INDEPENDENT_AMBULATORY_CARE_PROVIDER_SITE_OTHER): Payer: Self-pay

## 2024-06-12 DIAGNOSIS — K529 Noninfective gastroenteritis and colitis, unspecified: Secondary | ICD-10-CM

## 2024-06-12 DIAGNOSIS — N289 Disorder of kidney and ureter, unspecified: Secondary | ICD-10-CM

## 2024-06-12 DIAGNOSIS — Z79620 Long term (current) use of immunosuppressive biologic: Secondary | ICD-10-CM

## 2024-06-12 DIAGNOSIS — R748 Abnormal levels of other serum enzymes: Secondary | ICD-10-CM

## 2024-06-12 DIAGNOSIS — Z79899 Other long term (current) drug therapy: Secondary | ICD-10-CM

## 2024-06-12 DIAGNOSIS — K5100 Ulcerative (chronic) pancolitis without complications: Secondary | ICD-10-CM

## 2024-06-12 DIAGNOSIS — E7849 Other hyperlipidemia: Secondary | ICD-10-CM

## 2024-06-12 NOTE — Telephone Encounter (Signed)
 Orders placed for [facility]. Patient made aware.

## 2024-06-20 DIAGNOSIS — K5100 Ulcerative (chronic) pancolitis without complications: Secondary | ICD-10-CM | POA: Diagnosis not present

## 2024-06-20 DIAGNOSIS — Z79620 Long term (current) use of immunosuppressive biologic: Secondary | ICD-10-CM | POA: Diagnosis not present

## 2024-06-20 DIAGNOSIS — K529 Noninfective gastroenteritis and colitis, unspecified: Secondary | ICD-10-CM | POA: Diagnosis not present

## 2024-06-20 DIAGNOSIS — R748 Abnormal levels of other serum enzymes: Secondary | ICD-10-CM | POA: Diagnosis not present

## 2024-06-23 LAB — CBC WITH DIFFERENTIAL/PLATELET
Basophils Absolute: 0 x10E3/uL (ref 0.0–0.2)
Basos: 1 %
EOS (ABSOLUTE): 0 x10E3/uL (ref 0.0–0.4)
Eos: 0 %
Hematocrit: 41.5 % (ref 34.0–46.6)
Hemoglobin: 13.3 g/dL (ref 11.1–15.9)
Immature Grans (Abs): 0 x10E3/uL (ref 0.0–0.1)
Immature Granulocytes: 0 %
Lymphocytes Absolute: 2 x10E3/uL (ref 0.7–3.1)
Lymphs: 42 %
MCH: 30.3 pg (ref 26.6–33.0)
MCHC: 32 g/dL (ref 31.5–35.7)
MCV: 95 fL (ref 79–97)
Monocytes Absolute: 0.4 x10E3/uL (ref 0.1–0.9)
Monocytes: 9 %
Neutrophils Absolute: 2.2 x10E3/uL (ref 1.4–7.0)
Neutrophils: 48 %
Platelets: 223 x10E3/uL (ref 150–450)
RBC: 4.39 x10E6/uL (ref 3.77–5.28)
RDW: 11.9 % (ref 11.7–15.4)
WBC: 4.7 x10E3/uL (ref 3.4–10.8)

## 2024-06-23 LAB — COMPREHENSIVE METABOLIC PANEL WITH GFR
ALT: 91 IU/L — ABNORMAL HIGH (ref 0–32)
AST: 64 IU/L — ABNORMAL HIGH (ref 0–40)
Albumin: 4.8 g/dL (ref 3.9–4.9)
Alkaline Phosphatase: 40 IU/L — ABNORMAL LOW (ref 49–135)
BUN/Creatinine Ratio: 22 (ref 12–28)
BUN: 17 mg/dL (ref 8–27)
Bilirubin Total: 0.5 mg/dL (ref 0.0–1.2)
CO2: 26 mmol/L (ref 20–29)
Calcium: 10.4 mg/dL — ABNORMAL HIGH (ref 8.7–10.3)
Chloride: 102 mmol/L (ref 96–106)
Creatinine, Ser: 0.76 mg/dL (ref 0.57–1.00)
Globulin, Total: 3 g/dL (ref 1.5–4.5)
Glucose: 91 mg/dL (ref 70–99)
Potassium: 4.3 mmol/L (ref 3.5–5.2)
Sodium: 142 mmol/L (ref 134–144)
Total Protein: 7.8 g/dL (ref 6.0–8.5)
eGFR: 89 mL/min/1.73 (ref >59)

## 2024-06-23 LAB — CALPROTECTIN, FECAL: Calprotectin, Fecal: <5 ug/g (ref 0–120)

## 2024-06-23 LAB — C-REACTIVE PROTEIN: CRP: <1 mg/L (ref 0–10)

## 2024-06-26 ENCOUNTER — Encounter (INDEPENDENT_AMBULATORY_CARE_PROVIDER_SITE_OTHER): Payer: Self-pay

## 2024-06-26 ENCOUNTER — Ambulatory Visit (INDEPENDENT_AMBULATORY_CARE_PROVIDER_SITE_OTHER): Payer: Self-pay | Admitting: Gastroenterology

## 2024-06-27 DIAGNOSIS — Z79899 Other long term (current) drug therapy: Secondary | ICD-10-CM | POA: Diagnosis not present

## 2024-06-27 DIAGNOSIS — N289 Disorder of kidney and ureter, unspecified: Secondary | ICD-10-CM | POA: Diagnosis not present

## 2024-06-27 DIAGNOSIS — E7849 Other hyperlipidemia: Secondary | ICD-10-CM | POA: Diagnosis not present

## 2024-06-28 LAB — HEPATIC FUNCTION PANEL
ALT: 100 IU/L — ABNORMAL HIGH (ref 0–32)
AST: 73 IU/L — ABNORMAL HIGH (ref 0–40)
Albumin: 4.8 g/dL (ref 3.9–4.9)
Alkaline Phosphatase: 45 IU/L — ABNORMAL LOW (ref 49–135)
Bilirubin Total: 0.6 mg/dL (ref 0.0–1.2)
Bilirubin, Direct: 0.22 mg/dL (ref 0.00–0.40)
Total Protein: 7.7 g/dL (ref 6.0–8.5)

## 2024-06-28 LAB — LIPID PANEL
Chol/HDL Ratio: 2.3 ratio (ref 0.0–4.4)
Cholesterol, Total: 164 mg/dL (ref 100–199)
HDL: 71 mg/dL (ref >39)
LDL Chol Calc (NIH): 80 mg/dL (ref 0–99)
Triglycerides: 64 mg/dL (ref 0–149)
VLDL Cholesterol Cal: 13 mg/dL (ref 5–40)

## 2024-06-28 LAB — BASIC METABOLIC PANEL WITH GFR
BUN/Creatinine Ratio: 20 (ref 12–28)
BUN: 16 mg/dL (ref 8–27)
CO2: 25 mmol/L (ref 20–29)
Calcium: 10.1 mg/dL (ref 8.7–10.3)
Chloride: 103 mmol/L (ref 96–106)
Creatinine, Ser: 0.79 mg/dL (ref 0.57–1.00)
Glucose: 92 mg/dL (ref 70–99)
Potassium: 4.4 mmol/L (ref 3.5–5.2)
Sodium: 140 mmol/L (ref 134–144)
eGFR: 85 mL/min/1.73 (ref >59)

## 2024-07-02 ENCOUNTER — Ambulatory Visit: Payer: Self-pay | Admitting: Family Medicine

## 2024-07-04 ENCOUNTER — Ambulatory Visit: Admitting: Family Medicine

## 2024-07-04 ENCOUNTER — Encounter: Payer: Self-pay | Admitting: Family Medicine

## 2024-07-04 VITALS — BP 133/75 | HR 78 | Temp 98.8°F | Ht 64.0 in | Wt 120.5 lb

## 2024-07-04 DIAGNOSIS — R7401 Elevation of levels of liver transaminase levels: Secondary | ICD-10-CM

## 2024-07-04 DIAGNOSIS — Z23 Encounter for immunization: Secondary | ICD-10-CM | POA: Diagnosis not present

## 2024-07-04 NOTE — Progress Notes (Signed)
   Subjective:    Patient ID: Yvonne [name], female    DOB: [birth year], 63 y.o.   MRN: [id number]  HPI [name] is a 63 year old female who presents for follow-up on elevated liver enzymes and medication management.  She has experienced a progressive increase in liver enzymes, with levels rising from the teens to the nineties and then to the hundreds. There is concern that this elevation may be related to her current medication, Avsola , or her previous statin use. She has recently discontinued the statin to assess its impact on her liver enzymes. An MRI in 2013 revealed hemangiomas and a fatty liver, but she has not experienced any symptoms related to gallstones or liver discomfort. The patient reports no jaundice, abdominal pain, or changes in stool color.  She is currently on Avsola  for her gastrointestinal condition, which is effectively managing her symptoms without changes in bowel movements. A recent fecal exam was normal, and she is scheduled for her next infusion in two weeks. She has a history of using various medications for her gastrointestinal condition, including Humira  and its generic, but switched to Avsola  due to cost issues. She pays $100 foAvsola  administration but is facing insurance changes that will require her to pay out of pocket for medications and visits.  Her bone density has improved with Prolia  treatment, which she receives every six months. She is prepared to pay out of pocket for this medication as well.  Her general health has been good over the past several months. She maintains a regular exercise routine, attending classes at the [facility] twice a week and occasionally walking on Sundays. She has also improved her diet by reducing red meat intake and increasing her consumption of salmon, chicken, and vegetables. She drinks water, almond milk, and a caffeinated V8 juice daily, which she enjoys and does not cause her any palpitations.  Previous MRI from several years  ago showed a hemangioma liver also showed fatty liver given normal enzymes over the past few years before starting new medicine it is unlikely that fatty liver is causing elevated liver enzymes currently I would not recommend a repeat of the MRI Review of Systems     Objective:   Physical Exam  General-in no acute distress Eyes-no discharge Lungs-respiratory rate normal, CTA CV-no murmurs,RRR Extremities skin warm dry no edema Neuro grossly normal Behavior normal, alert Abdomen soft no masses      Assessment & Plan:  Elevation of liver transaminase levels Mild elevation of liver enzymes, likely due to Avsola  or rosuvastatin . No symptoms of liver dysfunction. Current elevation mild (3-5 times normal). - Held rosuvastatin  to assess impact on liver enzymes. - Recheck liver enzymes mid-December to end of December, 4-6 weeks after stopping rosuvastatin . - Monitor for symptoms of liver dysfunction. - Coordinate with gastroenterology regarding Avsola  and liver enzyme monitoring.  Osteoporosis Bone density improved with Prolia  and exercise. Right spine improved, left spine did not. Prolia  effective. - Continue Prolia  injections every six months. - Continue regular exercise regimen.  Immunization administration Due for flu shot.

## 2024-07-20 ENCOUNTER — Other Ambulatory Visit (INDEPENDENT_AMBULATORY_CARE_PROVIDER_SITE_OTHER): Payer: Self-pay

## 2024-07-20 DIAGNOSIS — R748 Abnormal levels of other serum enzymes: Secondary | ICD-10-CM

## 2024-07-21 ENCOUNTER — Ambulatory Visit

## 2024-07-28 ENCOUNTER — Encounter: Attending: Gastroenterology | Admitting: Emergency Medicine

## 2024-07-28 VITALS — BP 132/76 | HR 66 | Temp 98.2°F | Resp 15

## 2024-07-28 DIAGNOSIS — K5100 Ulcerative (chronic) pancolitis without complications: Secondary | ICD-10-CM | POA: Diagnosis not present

## 2024-07-28 MED ORDER — SODIUM CHLORIDE 0.9 % IV SOLN
300.0000 mg | Freq: Once | INTRAVENOUS | Status: AC
  Administered 2024-07-28: 11:00:00 300 mg via INTRAVENOUS
  Filled 2024-07-28: qty 30

## 2024-07-28 MED ORDER — ACETAMINOPHEN 325 MG PO TABS
650.0000 mg | ORAL_TABLET | Freq: Once | ORAL | Status: AC
  Administered 2024-07-28: 650 mg via ORAL

## 2024-07-28 MED ORDER — METHYLPREDNISOLONE SODIUM SUCC 40 MG IJ SOLR
40.0000 mg | Freq: Once | INTRAMUSCULAR | Status: AC
  Administered 2024-07-28: 40 mg via INTRAVENOUS

## 2024-07-28 MED ORDER — DIPHENHYDRAMINE HCL 25 MG PO CAPS
25.0000 mg | ORAL_CAPSULE | Freq: Once | ORAL | Status: AC
  Administered 2024-07-28: 25 mg via ORAL

## 2024-07-28 NOTE — Progress Notes (Signed)
 Diagnosis:  Ulcerative Colitis  Provider:  Eartha Sieving MD  Procedure: IV Infusion  IV Type: Peripheral, IV Location: R Antecubital  Avsola  (infliximab -axxq), Dose: 300 mg  Infusion Start Time: 1057  Infusion Stop Time: 1146  Post Infusion IV Care: Peripheral IV Discontinued  Discharge: Condition: Good, Destination: Home . AVS Declined  Performed by:  Delon ONEIDA Officer, RN

## 2024-08-04 DIAGNOSIS — R7401 Elevation of levels of liver transaminase levels: Secondary | ICD-10-CM | POA: Diagnosis not present

## 2024-08-05 LAB — HEPATIC FUNCTION PANEL
ALT: 128 IU/L — ABNORMAL HIGH (ref 0–32)
AST: 72 IU/L — ABNORMAL HIGH (ref 0–40)
Albumin: 4.7 g/dL (ref 3.9–4.9)
Alkaline Phosphatase: 47 IU/L — ABNORMAL LOW (ref 49–135)
Bilirubin Total: 0.4 mg/dL (ref 0.0–1.2)
Bilirubin, Direct: 0.11 mg/dL (ref 0.00–0.40)
Total Protein: 7.4 g/dL (ref 6.0–8.5)

## 2024-08-09 ENCOUNTER — Ambulatory Visit: Payer: Self-pay | Admitting: Family Medicine

## 2024-08-11 ENCOUNTER — Telehealth: Payer: Self-pay | Admitting: Family Medicine

## 2024-08-11 NOTE — Telephone Encounter (Signed)
 Previously her liver enzymes were elevated It was hard to know if that was due to the GI medicine or her statin We stopped her statin in mid November Her liver enzymes are still elevated Please give some input regarding what the patient needs to do from this point forward regarding the liver enzymes.  If you would be so kind as to also send a message to the patient that would be great regarding the above  Currently I am holding off on initiating any different cholesterol medicines until I hear further guidance from you   I look forward to your reply Feel free to send a reply next week or whenever it is convenient for you thank you-[facility]

## 2024-08-22 NOTE — Telephone Encounter (Signed)
 I appreciate you looking into this Appreciate the additional test We will follow along peripherally Did not know if this elevated liver enzymes was related to her inflammatory bowel disease medication Once again thank you for your ongoing efforts and care

## 2024-08-23 ENCOUNTER — Other Ambulatory Visit (INDEPENDENT_AMBULATORY_CARE_PROVIDER_SITE_OTHER): Payer: Self-pay | Admitting: Gastroenterology

## 2024-08-23 DIAGNOSIS — R748 Abnormal levels of other serum enzymes: Secondary | ICD-10-CM

## 2024-08-24 ENCOUNTER — Encounter: Payer: Self-pay | Admitting: Gastroenterology

## 2024-08-25 ENCOUNTER — Telehealth: Payer: Self-pay

## 2024-08-25 NOTE — Telephone Encounter (Signed)
 Auth Submission: PENDING Site of care: Site of care: [facility] Payer: [facility] Medication & CPT/J Code(s) submitted: Avsola  (infliximab -axxq) [phone] Diagnosis Code:  Route of submission (phone, fax, portal): portal Phone # Fax # Auth type: Buy/Bill PB Units/visits requested: 5mg /kg q8weeks Reference number: [id number]  Approval from:  to

## 2024-08-28 LAB — IGG, IGA, IGM
IgG (Immunoglobin G), Serum: 1540 mg/dL (ref 586–1602)
IgM (Immunoglobulin M), Srm: 164 mg/dL (ref 26–217)
Immunoglobulin A, (IgA) QN, Serum: 147 mg/dL (ref 87–352)

## 2024-08-28 LAB — IRON,TIBC AND FERRITIN PANEL
Ferritin: 246 ng/mL — ABNORMAL HIGH (ref 15–150)
Iron Saturation: 34 % (ref 15–55)
Iron: 111 ug/dL (ref 27–139)
Total Iron Binding Capacity: 327 ug/dL (ref 250–450)
UIBC: 216 ug/dL (ref 118–369)

## 2024-08-28 LAB — ACUTE VIRAL HEPATITIS (HAV, HBV, HCV)
HCV Ab: NONREACTIVE
Hep A IgM: NEGATIVE
Hep B C IgM: NEGATIVE
Hepatitis B Surface Ag: NEGATIVE

## 2024-08-28 LAB — ANA

## 2024-08-28 LAB — CERULOPLASMIN: Ceruloplasmin: 29.5 mg/dL (ref 19.0–39.0)

## 2024-08-28 LAB — HCV INTERPRETATION

## 2024-08-28 LAB — MITOCHONDRIAL ANTIBODIES: Mitochondrial Ab: <20 U (ref 0.0–20.0)

## 2024-08-28 LAB — ANTI-SMOOTH MUSCLE ANTIBODY, IGG: Smooth Muscle Ab: 9 U (ref 0–19)

## 2024-08-28 LAB — ALPHA-1-ANTITRYPSIN: A-1 Antitrypsin: 100 mg/dL — ABNORMAL LOW (ref 101–187)

## 2024-08-29 ENCOUNTER — Ambulatory Visit (INDEPENDENT_AMBULATORY_CARE_PROVIDER_SITE_OTHER): Payer: Self-pay | Admitting: Gastroenterology

## 2024-08-29 ENCOUNTER — Other Ambulatory Visit (INDEPENDENT_AMBULATORY_CARE_PROVIDER_SITE_OTHER): Payer: Self-pay | Admitting: Gastroenterology

## 2024-08-29 DIAGNOSIS — E8801 Alpha-1-antitrypsin deficiency: Secondary | ICD-10-CM

## 2024-08-29 DIAGNOSIS — R748 Abnormal levels of other serum enzymes: Secondary | ICD-10-CM

## 2024-08-29 DIAGNOSIS — R7989 Other specified abnormal findings of blood chemistry: Secondary | ICD-10-CM

## 2024-09-01 ENCOUNTER — Ambulatory Visit (HOSPITAL_COMMUNITY)
Admission: RE | Admit: 2024-09-01 | Discharge: 2024-09-01 | Disposition: A | Discharge status: Home / Self Care | Source: Ambulatory Visit | Attending: Gastroenterology | Admitting: Gastroenterology

## 2024-09-01 DIAGNOSIS — R748 Abnormal levels of other serum enzymes: Secondary | ICD-10-CM | POA: Diagnosis present

## 2024-09-22 ENCOUNTER — Encounter (INDEPENDENT_AMBULATORY_CARE_PROVIDER_SITE_OTHER): Payer: Self-pay | Admitting: Gastroenterology

## 2024-09-22 ENCOUNTER — Encounter: Payer: Self-pay | Admitting: Gastroenterology

## 2024-09-22 ENCOUNTER — Encounter: Attending: Gastroenterology | Admitting: *Deleted

## 2024-09-22 VITALS — BP 136/69 | HR 73 | Temp 97.7°F | Resp 16 | Wt 120.6 lb

## 2024-09-22 DIAGNOSIS — K5100 Ulcerative (chronic) pancolitis without complications: Secondary | ICD-10-CM

## 2024-09-22 MED ORDER — DIPHENHYDRAMINE HCL 25 MG PO CAPS: 25.0000 mg | ORAL_CAPSULE | Freq: Once | ORAL | Status: DC

## 2024-09-22 MED ORDER — METHYLPREDNISOLONE SODIUM SUCC 40 MG IJ SOLR
40.0000 mg | Freq: Once | INTRAMUSCULAR | Status: AC
  Administered 2024-09-22: 40 mg via INTRAVENOUS

## 2024-09-22 MED ORDER — ACETAMINOPHEN 325 MG PO TABS: 650.0000 mg | ORAL_TABLET | Freq: Once | ORAL | Status: DC

## 2024-09-22 MED ORDER — SODIUM CHLORIDE 0.9 % IV SOLN
5.0000 mg/kg | Freq: Once | INTRAVENOUS | Status: AC
  Administered 2024-09-22: 300 mg via INTRAVENOUS
  Filled 2024-09-22: qty 30

## 2024-09-22 NOTE — Progress Notes (Cosign Needed)
 Diagnosis: Ulcerative Colitis  Provider:  Shaaron Charleston MD  Procedure: IV Infusion  IV Type: Peripheral, IV Location: R Antecubital  Avsola  (infliximab -axxq), Dose: 300 mg  Infusion Start Time: 1025  Infusion Stop Time: 1135  Post Infusion IV Care: Observation period completed and Peripheral IV Discontinued  Discharge: Condition: Good, Destination: Home . AVS Declined  Performed by:  Baldwin Darice Helling, RN

## 2024-11-17 ENCOUNTER — Ambulatory Visit
